# Patient Record
Sex: Male | Born: 1950 | Race: White | Hispanic: No | Marital: Married
Health system: Southern US, Community
[De-identification: ages and names within clinical notes are randomized; demographics above are authoritative.]

## PROBLEM LIST (undated history)

## (undated) DIAGNOSIS — J1282 Pneumonia due to coronavirus disease 2019: Secondary | ICD-10-CM

## (undated) DIAGNOSIS — U071 COVID-19: Secondary | ICD-10-CM

## (undated) DIAGNOSIS — I482 Chronic atrial fibrillation, unspecified: Secondary | ICD-10-CM

## (undated) DIAGNOSIS — J9621 Acute and chronic respiratory failure with hypoxia: Secondary | ICD-10-CM

## (undated) DIAGNOSIS — J189 Pneumonia, unspecified organism: Secondary | ICD-10-CM

---

## 2020-12-06 ENCOUNTER — Inpatient Hospital Stay
Admit: 2020-12-06 | Discharge: 2020-12-16 | Disposition: A | Discharge status: Still In Hospital | Payer: Medicare Other | Source: Ambulatory Visit | Attending: Internal Medicine | Admitting: Internal Medicine

## 2020-12-06 DIAGNOSIS — J1282 Pneumonia due to coronavirus disease 2019: Secondary | ICD-10-CM | POA: Diagnosis present

## 2020-12-06 DIAGNOSIS — Z789 Other specified health status: Secondary | ICD-10-CM

## 2020-12-06 DIAGNOSIS — J9621 Acute and chronic respiratory failure with hypoxia: Secondary | ICD-10-CM | POA: Diagnosis present

## 2020-12-06 DIAGNOSIS — R0902 Hypoxemia: Secondary | ICD-10-CM

## 2020-12-06 DIAGNOSIS — R52 Pain, unspecified: Secondary | ICD-10-CM

## 2020-12-06 DIAGNOSIS — Z0189 Encounter for other specified special examinations: Secondary | ICD-10-CM

## 2020-12-06 DIAGNOSIS — U071 COVID-19: Secondary | ICD-10-CM | POA: Diagnosis present

## 2020-12-06 DIAGNOSIS — I482 Chronic atrial fibrillation, unspecified: Secondary | ICD-10-CM | POA: Diagnosis present

## 2020-12-06 DIAGNOSIS — J189 Pneumonia, unspecified organism: Secondary | ICD-10-CM | POA: Diagnosis present

## 2020-12-06 DIAGNOSIS — J9690 Respiratory failure, unspecified, unspecified whether with hypoxia or hypercapnia: Secondary | ICD-10-CM

## 2020-12-06 HISTORY — DX: Acute and chronic respiratory failure with hypoxia: J96.21

## 2020-12-06 HISTORY — DX: Chronic atrial fibrillation, unspecified: I48.20

## 2020-12-06 HISTORY — DX: Pneumonia due to coronavirus disease 2019: J12.82

## 2020-12-06 HISTORY — DX: Pneumonia, unspecified organism: J18.9

## 2020-12-06 HISTORY — DX: COVID-19: U07.1

## 2020-12-07 ENCOUNTER — Other Ambulatory Visit (HOSPITAL_COMMUNITY): Payer: Medicare Other

## 2020-12-07 LAB — CBC
HCT: 49.8 % (ref 39.0–52.0)
Hemoglobin: 16.5 g/dL (ref 13.0–17.0)
MCH: 31.4 pg (ref 26.0–34.0)
MCHC: 33.1 g/dL (ref 30.0–36.0)
MCV: 94.7 fL (ref 80.0–100.0)
Platelets: 311 10*3/uL (ref 150–400)
RBC: 5.26 MIL/uL (ref 4.22–5.81)
RDW: 13.2 % (ref 11.5–15.5)
WBC: 10 10*3/uL (ref 4.0–10.5)
nRBC: 0 % (ref 0.0–0.2)

## 2020-12-07 LAB — PHOSPHORUS: Phosphorus: 4.6 mg/dL (ref 2.5–4.6)

## 2020-12-07 LAB — BLOOD GAS, ARTERIAL
Acid-Base Excess: 5.9 mmol/L — ABNORMAL HIGH (ref 0.0–2.0)
Bicarbonate: 29.2 mmol/L — ABNORMAL HIGH (ref 20.0–28.0)
FIO2: 100
O2 Saturation: 95 %
Patient temperature: 37
pCO2 arterial: 36.5 mmHg (ref 32.0–48.0)
pH, Arterial: 7.513 — ABNORMAL HIGH (ref 7.350–7.450)
pO2, Arterial: 70.9 mmHg — ABNORMAL LOW (ref 83.0–108.0)

## 2020-12-07 LAB — PROTIME-INR
INR: 1.7 — ABNORMAL HIGH (ref 0.8–1.2)
Prothrombin Time: 19.1 seconds — ABNORMAL HIGH (ref 11.4–15.2)

## 2020-12-07 LAB — COMPREHENSIVE METABOLIC PANEL
ALT: 89 U/L — ABNORMAL HIGH (ref 0–44)
AST: 105 U/L — ABNORMAL HIGH (ref 15–41)
Albumin: 3 g/dL — ABNORMAL LOW (ref 3.5–5.0)
Alkaline Phosphatase: 82 U/L (ref 38–126)
Anion gap: 15 (ref 5–15)
BUN: 42 mg/dL — ABNORMAL HIGH (ref 8–23)
CO2: 26 mmol/L (ref 22–32)
Calcium: 8.2 mg/dL — ABNORMAL LOW (ref 8.9–10.3)
Chloride: 88 mmol/L — ABNORMAL LOW (ref 98–111)
Creatinine, Ser: 1.41 mg/dL — ABNORMAL HIGH (ref 0.61–1.24)
GFR, Estimated: 54 mL/min — ABNORMAL LOW (ref >60)
Glucose, Bld: 108 mg/dL — ABNORMAL HIGH (ref 70–99)
Potassium: 3.3 mmol/L — ABNORMAL LOW (ref 3.5–5.1)
Sodium: 129 mmol/L — ABNORMAL LOW (ref 135–145)
Total Bilirubin: 3 mg/dL — ABNORMAL HIGH (ref 0.3–1.2)
Total Protein: 6.9 g/dL (ref 6.5–8.1)

## 2020-12-07 LAB — MAGNESIUM: Magnesium: 2.4 mg/dL (ref 1.7–2.4)

## 2020-12-07 LAB — APTT: aPTT: 35 seconds (ref 24–36)

## 2020-12-08 LAB — BASIC METABOLIC PANEL
Anion gap: 13 (ref 5–15)
BUN: 48 mg/dL — ABNORMAL HIGH (ref 8–23)
CO2: 32 mmol/L (ref 22–32)
Calcium: 8.2 mg/dL — ABNORMAL LOW (ref 8.9–10.3)
Chloride: 91 mmol/L — ABNORMAL LOW (ref 98–111)
Creatinine, Ser: 1.42 mg/dL — ABNORMAL HIGH (ref 0.61–1.24)
GFR, Estimated: 53 mL/min — ABNORMAL LOW (ref >60)
Glucose, Bld: 192 mg/dL — ABNORMAL HIGH (ref 70–99)
Potassium: 4 mmol/L (ref 3.5–5.1)
Sodium: 136 mmol/L (ref 135–145)

## 2020-12-09 ENCOUNTER — Other Ambulatory Visit (HOSPITAL_COMMUNITY): Payer: Medicare Other

## 2020-12-09 DIAGNOSIS — J189 Pneumonia, unspecified organism: Secondary | ICD-10-CM

## 2020-12-09 DIAGNOSIS — U071 COVID-19: Secondary | ICD-10-CM

## 2020-12-09 DIAGNOSIS — I482 Chronic atrial fibrillation, unspecified: Secondary | ICD-10-CM | POA: Diagnosis not present

## 2020-12-09 DIAGNOSIS — J1282 Pneumonia due to coronavirus disease 2019: Secondary | ICD-10-CM

## 2020-12-09 DIAGNOSIS — J9621 Acute and chronic respiratory failure with hypoxia: Secondary | ICD-10-CM

## 2020-12-09 NOTE — Consult Note (Signed)
Pulmonary Critical Care Medicine [facility] [place]  PULMONARY SERVICE  Date of Service: 12/09/2020  PULMONARY CRITICAL CARE CONSULT   Matthew Oliver  MPN:[id number]  DOB: 1951-01-02   DOA: 12/06/2020  Referring Physician: Carron Curie, MD  HPI: Matthew Oliver is a 70 y.o. male seen for follow up of Acute on Chronic Respiratory Failure.  Patient has multiple medical problems including hypertension hyperlipidemia came into the hospital because of shortness of breath which are going on for a few months.  Patient also was noted to be more somnolent.  It was initially suspected patient has some obstructive sleep apnea obesity hypoventilation syndrome after admission patient was noted to have temperature with pneumonia and had a positive for COVID test.  Patient was given varsity neb as well as some steroids.  The patient also was placed on anticoagulation had some issues with hemoptysis and therefore the anticoagulation was stopped.  The patient is now transferred to [place] for further management currently is on a high flow 40 L and 100% FiO2 and also history requiring a nonrebreather.  Review of Systems:  ROS performed and is unremarkable other than noted above.  Past Medical History Past Medical History:  Diagnosis Date  . Hyperlipidemia  . Hypertension    Past Surgical History No past surgical history on file.  Social History reports that he has never smoked. He has never used smokeless tobacco. He reports current alcohol use of about 4.0 standard drinks of alcohol per week. He reports that he does not use drugs.  Family History family history includes Hypertension in his father.   Allergies No Known Allergies    Medications: Reviewed on Rounds  Physical Exam:  Vitals: Temperature is 96.8 pulse 76 respiratory rate 26 blood pressure is 116/80 saturations 90%  Ventilator Settings off the ventilator on heated high flow 40 L FiO2 100%  . General: Comfortable at this  time . Eyes: Grossly normal lids, irises & conjunctiva . ENT: grossly tongue is normal . Neck: no obvious mass . Cardiovascular: S1-S2 normal no gallop or rub . Respiratory: Coarse rhonchi expansion is equal . Abdomen: Soft and nontender . Skin: no rash seen on limited exam . Musculoskeletal: not rigid . Psychiatric:unable to assess . Neurologic: no seizure no involuntary movements         Labs on Admission:  Basic Metabolic Panel: Recent Labs  Lab 12/07/20 0527 12/08/20 0417  NA 129* 136  K 3.3* 4.0  CL 88* 91*  CO2 26 32  GLUCOSE 108* 192*  BUN 42* 48*  CREATININE 1.41* 1.42*  CALCIUM 8.2* 8.2*  MG 2.4  --   PHOS 4.6  --     Recent Labs  Lab 12/07/20 0937  PHART 7.513*  PCO2ART 36.5  PO2ART 70.9*  HCO3 29.2*  O2SAT 95.0    Liver Function Tests: Recent Labs  Lab 12/07/20 0527  AST 105*  ALT 89*  ALKPHOS 82  BILITOT 3.0*  PROT 6.9  ALBUMIN 3.0*   No results for input(s): LIPASE, AMYLASE in the last 168 hours. No results for input(s): AMMONIA in the last 168 hours.  CBC: Recent Labs  Lab 12/07/20 0527  WBC 10.0  HGB 16.5  HCT 49.8  MCV 94.7  PLT 311    Cardiac Enzymes: No results for input(s): CKTOTAL, CKMB, CKMBINDEX, TROPONINI in the last 168 hours.  BNP (last 3 results) No results for input(s): BNP in the last 8760 hours.  ProBNP (last 3 results) No results for input(s): PROBNP in the  last 8760 hours.   Radiological Exams on Admission: DG Chest 1 View  Result Date: 12/07/2020 CLINICAL DATA:  Respiratory failure. EXAM: CHEST  1 VIEW COMPARISON:  No prior. FINDINGS: Cardiomegaly. Diffuse bilateral interstitial infiltrates/edema. Low lung volumes. No pleural effusion or pneumothorax. Metallic fragments noted over the right chest. No acute bony abnormality identified. Thoracic spine scoliosis and degenerative change. IMPRESSION: 1. Cardiomegaly. 2. Diffuse bilateral interstitial infiltrates/edema. Low lung volumes. Electronically Signed    By: Maisie Fus  Register   On: 12/07/2020 06:07    Assessment/Plan Active Problems:   Acute on chronic respiratory failure with hypoxia (HCC)   Chronic atrial fibrillation (HCC)   COVID-19 virus infection   Pneumonia due to COVID-19 virus   Community acquired pneumonia of both lower lobes   1. Acute on chronic respiratory failure with hypoxia at this time patient's oxygen requirements are still quite high we will continue to monitor closely can maintain saturations greater than or equal to 88%. 2. Chronic atrial fibrillation currently the patient's rate is controlled this is apparently a new diagnosis. 3. Community-acquired pneumonia patient was treated with antibiotics at the other facility we will continue with supportive care 4. COVID-19 virus infection in recovery right now but still has diffuse interstitial infiltrates consistent with Covid pneumonia. 5. COVID-19 pneumonia patient was treated with steroids as well as baricitinib will continue to monitor closely patient also has received empiric antibiotics  I have personally seen and evaluated the patient, evaluated laboratory and imaging results, formulated the assessment and plan and placed orders. The Patient requires high complexity decision making with multiple systems involvement.  Case was discussed on Rounds with the Respiratory Therapy Director and the Respiratory staff Time Spent  Yevonne Pax, MD [facility] Pulmonary Critical Care Medicine Sleep Medicine

## 2020-12-10 ENCOUNTER — Encounter: Payer: Self-pay | Admitting: Internal Medicine

## 2020-12-10 DIAGNOSIS — J189 Pneumonia, unspecified organism: Secondary | ICD-10-CM | POA: Diagnosis not present

## 2020-12-10 DIAGNOSIS — U071 COVID-19: Secondary | ICD-10-CM | POA: Diagnosis present

## 2020-12-10 DIAGNOSIS — J1282 Pneumonia due to coronavirus disease 2019: Secondary | ICD-10-CM | POA: Diagnosis present

## 2020-12-10 DIAGNOSIS — I482 Chronic atrial fibrillation, unspecified: Secondary | ICD-10-CM | POA: Diagnosis not present

## 2020-12-10 DIAGNOSIS — J9621 Acute and chronic respiratory failure with hypoxia: Secondary | ICD-10-CM | POA: Diagnosis not present

## 2020-12-10 NOTE — Progress Notes (Signed)
Pulmonary Critical Care Medicine [facility] [place]   PULMONARY CRITICAL CARE SERVICE  PROGRESS NOTE  Date of Service: 12/10/2020  Matthew Oliver  XIP:[id number]  DOB: [birth year]   DOA: 12/06/2020  Referring Physician: Carron Curie, MD  HPI: Matthew Oliver is a 70 y.o. male seen for follow up of Acute on Chronic Respiratory Failure.  Patient currently is on 40 L of oxygen saturations 100%  Medications: Reviewed on Rounds  Physical Exam:  Vitals: Temperature is 97.8 pulse 74 respiratory 23 blood pressure is 116/77 saturations 85  Ventilator Settings on 40 L high flow 100% nonrebreather  . General: Comfortable at this time . Eyes: Grossly normal lids, irises & conjunctiva . ENT: grossly tongue is normal . Neck: no obvious mass . Cardiovascular: S1 S2 normal no gallop . Respiratory: No rhonchi coarse breath sounds . Abdomen: soft . Skin: no rash seen on limited exam . Musculoskeletal: not rigid . Psychiatric:unable to assess . Neurologic: no seizure no involuntary movements         Lab Data:   Basic Metabolic Panel: Recent Labs  Lab 12/07/20 0527 12/08/20 0417  NA 129* 136  K 3.3* 4.0  CL 88* 91*  CO2 26 32  GLUCOSE 108* 192*  BUN 42* 48*  CREATININE 1.41* 1.42*  CALCIUM 8.2* 8.2*  MG 2.4  --   PHOS 4.6  --     ABG: Recent Labs  Lab 12/07/20 0937  PHART 7.513*  PCO2ART 36.5  PO2ART 70.9*  HCO3 29.2*  O2SAT 95.0    Liver Function Tests: Recent Labs  Lab 12/07/20 0527  AST 105*  ALT 89*  ALKPHOS 82  BILITOT 3.0*  PROT 6.9  ALBUMIN 3.0*   No results for input(s): LIPASE, AMYLASE in the last 168 hours. No results for input(s): AMMONIA in the last 168 hours.  CBC: Recent Labs  Lab 12/07/20 0527  WBC 10.0  HGB 16.5  HCT 49.8  MCV 94.7  PLT 311    Cardiac Enzymes: No results for input(s): CKTOTAL, CKMB, CKMBINDEX, TROPONINI in the last 168 hours.  BNP (last 3 results) No results for input(s): BNP in the last 8760  hours.  ProBNP (last 3 results) No results for input(s): PROBNP in the last 8760 hours.  Radiological Exams: DG Abd 1 View  Result Date: 12/09/2020 CLINICAL DATA:  Check NG tube EXAM: ABDOMEN - 1 VIEW COMPARISON:  None. FINDINGS: Weighted feeding catheter is noted with the tip in the second portion of the duodenum. Scattered large and small bowel gas is noted. IMPRESSION: Dobbhoff catheter in the second portion of the duodenum. Electronically Signed   By: Alcide Clever M.D.   On: 12/09/2020 16:24    Assessment/Plan Active Problems:   Acute on chronic respiratory failure with hypoxia (HCC)   Chronic atrial fibrillation (HCC)   COVID-19 virus infection   Pneumonia due to COVID-19 virus   Community acquired pneumonia of both lower lobes   1. Acute on chronic respiratory failure hypoxia we will continue with oxygen therapy titrate as tolerated. 2. Chronic atrial fibrillation rate is controlled 3. COVID-19 virus infection 4. Pneumonia due to COVID-19 slow improvement 5. Community-acquired pneumonia treated we will continue to follow along   I have personally seen and evaluated the patient, evaluated laboratory and imaging results, formulated the assessment and plan and placed orders. The Patient requires high complexity decision making with multiple systems involvement.  Rounds were done with the Respiratory Therapy Director and Staff therapists and discussed with nursing staff also.  Allyne Gee, MD [facility] Pulmonary Critical Care Medicine Sleep Medicine

## 2020-12-11 ENCOUNTER — Other Ambulatory Visit (HOSPITAL_COMMUNITY): Payer: Medicare Other

## 2020-12-11 DIAGNOSIS — J9621 Acute and chronic respiratory failure with hypoxia: Secondary | ICD-10-CM | POA: Diagnosis not present

## 2020-12-11 DIAGNOSIS — U071 COVID-19: Secondary | ICD-10-CM | POA: Diagnosis not present

## 2020-12-11 DIAGNOSIS — I482 Chronic atrial fibrillation, unspecified: Secondary | ICD-10-CM | POA: Diagnosis not present

## 2020-12-11 DIAGNOSIS — J189 Pneumonia, unspecified organism: Secondary | ICD-10-CM | POA: Diagnosis not present

## 2020-12-11 NOTE — Progress Notes (Signed)
Pulmonary Critical Care Medicine [facility] [place]   PULMONARY CRITICAL CARE SERVICE  PROGRESS NOTE  Date of Service: 12/11/2020  Matthew Oliver  [id number]  DOB: [birth year]   DOA: 12/06/2020  Referring Physician: Carron Curie, MD  HPI: Matthew Oliver is a 70 y.o. male seen for follow up of Acute on Chronic Respiratory Failure.  Patient currently is on high flow 40 L 100% FiO2  Medications: Reviewed on Rounds  Physical Exam:  Vitals: Temperature is 96.6 pulse 104 respiratory rate 39 blood pressure 139/79 saturations 100%  Ventilator Settings on high flow 40 L FiO2 100%  . General: Comfortable at this time . Eyes: Grossly normal lids, irises & conjunctiva . ENT: grossly tongue is normal . Neck: no obvious mass . Cardiovascular: S1 S2 normal no gallop . Respiratory: Scattered rhonchi expansion is equal . Abdomen: soft . Skin: no rash seen on limited exam . Musculoskeletal: not rigid . Psychiatric:unable to assess . Neurologic: no seizure no involuntary movements         Lab Data:   Basic Metabolic Panel: Recent Labs  Lab 12/07/20 0527 12/08/20 0417  NA 129* 136  K 3.3* 4.0  CL 88* 91*  CO2 26 32  GLUCOSE 108* 192*  BUN 42* 48*  CREATININE 1.41* 1.42*  CALCIUM 8.2* 8.2*  MG 2.4  --   PHOS 4.6  --     ABG: Recent Labs  Lab 12/07/20 0937  PHART 7.513*  PCO2ART 36.5  PO2ART 70.9*  HCO3 29.2*  O2SAT 95.0    Liver Function Tests: Recent Labs  Lab 12/07/20 0527  AST 105*  ALT 89*  ALKPHOS 82  BILITOT 3.0*  PROT 6.9  ALBUMIN 3.0*   No results for input(s): LIPASE, AMYLASE in the last 168 hours. No results for input(s): AMMONIA in the last 168 hours.  CBC: Recent Labs  Lab 12/07/20 0527  WBC 10.0  HGB 16.5  HCT 49.8  MCV 94.7  PLT 311    Cardiac Enzymes: No results for input(s): CKTOTAL, CKMB, CKMBINDEX, TROPONINI in the last 168 hours.  BNP (last 3 results) No results for input(s): BNP in the last 8760 hours.  ProBNP  (last 3 results) No results for input(s): PROBNP in the last 8760 hours.  Radiological Exams: DG Abd 1 View  Result Date: 12/09/2020 CLINICAL DATA:  Check NG tube EXAM: ABDOMEN - 1 VIEW COMPARISON:  None. FINDINGS: Weighted feeding catheter is noted with the tip in the second portion of the duodenum. Scattered large and small bowel gas is noted. IMPRESSION: Dobbhoff catheter in the second portion of the duodenum. Electronically Signed   By: Alcide Clever M.D.   On: 12/09/2020 16:24    Assessment/Plan Active Problems:   Acute on chronic respiratory failure with hypoxia (HCC)   Chronic atrial fibrillation (HCC)   COVID-19 virus infection   Pneumonia due to COVID-19 virus   Community acquired pneumonia of both lower lobes   1. Acute on chronic respiratory failure hypoxia we will continue with heated high flow patient is on 40 L of oxygen 2. Chronic atrial fibrillation rate is controlled 3. COVID-19 virus infection rate now is in recovery still severe disease 4. Pneumonia due to COVID-19 slow improvement we will continue to monitor. 5. Community-acquired pneumonia has been treated bilateral pneumonia was noted on the previous hospitalization   I have personally seen and evaluated the patient, evaluated laboratory and imaging results, formulated the assessment and plan and placed orders. The Patient requires high complexity decision making  with multiple systems involvement.  Rounds were done with the Respiratory Therapy Director and Staff therapists and discussed with nursing staff also.  Allyne Gee, MD [facility] Pulmonary Critical Care Medicine Sleep Medicine

## 2020-12-12 ENCOUNTER — Other Ambulatory Visit (HOSPITAL_COMMUNITY): Payer: Medicare Other

## 2020-12-12 DIAGNOSIS — U071 COVID-19: Secondary | ICD-10-CM | POA: Diagnosis not present

## 2020-12-12 DIAGNOSIS — I482 Chronic atrial fibrillation, unspecified: Secondary | ICD-10-CM | POA: Diagnosis not present

## 2020-12-12 DIAGNOSIS — J9621 Acute and chronic respiratory failure with hypoxia: Secondary | ICD-10-CM | POA: Diagnosis not present

## 2020-12-12 DIAGNOSIS — J189 Pneumonia, unspecified organism: Secondary | ICD-10-CM | POA: Diagnosis not present

## 2020-12-12 LAB — BASIC METABOLIC PANEL
Anion gap: 15 (ref 5–15)
BUN: 51 mg/dL — ABNORMAL HIGH (ref 8–23)
CO2: 28 mmol/L (ref 22–32)
Calcium: 8.4 mg/dL — ABNORMAL LOW (ref 8.9–10.3)
Chloride: 99 mmol/L (ref 98–111)
Creatinine, Ser: 1.29 mg/dL — ABNORMAL HIGH (ref 0.61–1.24)
GFR, Estimated: >60 mL/min (ref >60)
Glucose, Bld: 153 mg/dL — ABNORMAL HIGH (ref 70–99)
Potassium: 4 mmol/L (ref 3.5–5.1)
Sodium: 142 mmol/L (ref 135–145)

## 2020-12-12 NOTE — Progress Notes (Signed)
Pulmonary Critical Care Medicine [facility] [place]   PULMONARY CRITICAL CARE SERVICE  PROGRESS NOTE  Date of Service: 12/12/2020  Matthew Oliver  [id number]  DOB: 1951-05-19   DOA: 12/06/2020  Referring Physician: Carron Curie, MD  HPI: Matthew Oliver is a 70 y.o. male seen for follow up of Acute on Chronic Respiratory Failure.  Patient currently is on BiPAP has been on a BiPAP of 16/8  Medications: Reviewed on Rounds  Physical Exam:  Vitals: Temperature is 97.6 pulse 89 respiratory 28 blood pressure is 139/78 saturations 86%  Ventilator Settings on BiPAP 16/8  . General: Comfortable at this time . Eyes: Grossly normal lids, irises & conjunctiva . ENT: grossly tongue is normal . Neck: no obvious mass . Cardiovascular: S1 S2 normal no gallop . Respiratory: Coarse rhonchi expansion equal . Abdomen: soft . Skin: no rash seen on limited exam . Musculoskeletal: not rigid . Psychiatric:unable to assess . Neurologic: no seizure no involuntary movements         Lab Data:   Basic Metabolic Panel: Recent Labs  Lab 12/07/20 0527 12/08/20 0417 12/12/20 0435  NA 129* 136 142  K 3.3* 4.0 4.0  CL 88* 91* 99  CO2 26 32 28  GLUCOSE 108* 192* 153*  BUN 42* 48* 51*  CREATININE 1.41* 1.42* 1.29*  CALCIUM 8.2* 8.2* 8.4*  MG 2.4  --   --   PHOS 4.6  --   --     ABG: Recent Labs  Lab 12/07/20 0937  PHART 7.513*  PCO2ART 36.5  PO2ART 70.9*  HCO3 29.2*  O2SAT 95.0    Liver Function Tests: Recent Labs  Lab 12/07/20 0527  AST 105*  ALT 89*  ALKPHOS 82  BILITOT 3.0*  PROT 6.9  ALBUMIN 3.0*   No results for input(s): LIPASE, AMYLASE in the last 168 hours. No results for input(s): AMMONIA in the last 168 hours.  CBC: Recent Labs  Lab 12/07/20 0527  WBC 10.0  HGB 16.5  HCT 49.8  MCV 94.7  PLT 311    Cardiac Enzymes: No results for input(s): CKTOTAL, CKMB, CKMBINDEX, TROPONINI in the last 168 hours.  BNP (last 3 results) No results for  input(s): BNP in the last 8760 hours.  ProBNP (last 3 results) No results for input(s): PROBNP in the last 8760 hours.  Radiological Exams: DG CHEST PORT 1 VIEW  Result Date: 12/12/2020 CLINICAL DATA:  Acute on chronic respiratory failure. EXAM: PORTABLE CHEST 1 VIEW COMPARISON:  Chest x-ray 12/07/2020 FINDINGS: Enteric tube coursing below the hemidiaphragm with tip collimated off view. The heart size and mediastinal contours are unchanged with persistent cardiomegaly. Interval worsening of diffuse patchy airspace and interstitial opacities. No pleural effusion. No pneumothorax. No acute osseous abnormality. Retained metallic densities at the right base again noted. IMPRESSION: 1. Interval worsening of diffuse patchy airspace and interstitial opacities. 2. Enteric tube courses below the hemidiaphragm with tip collimated off view. Electronically Signed   By: Tish Frederickson M.D.   On: 12/12/2020 06:50    Assessment/Plan Active Problems:   Acute on chronic respiratory failure with hypoxia (HCC)   Chronic atrial fibrillation (HCC)   COVID-19 virus infection   Pneumonia due to COVID-19 virus   Community acquired pneumonia of both lower lobes   1. Acute on chronic respiratory failure with hypoxia there is been some worsening of the infiltrates noted on the chest film spoke with primary care team to update also spoke with respiratory therapy on rounds to increase the BiPAP  pressure will try to go with the BiPAP of 20/10 2. Chronic atrial fibrillation rate now rate controlled we will continue with supportive care 3. COVID-19 virus infection recovery 4. Pneumonia due to COVID-19 treated 5. Community-acquired pneumonia treated supportive care   I have personally seen and evaluated the patient, evaluated laboratory and imaging results, formulated the assessment and plan and placed orders. The Patient requires high complexity decision making with multiple systems involvement.  Rounds were done with  the Respiratory Therapy Director and Staff therapists and discussed with nursing staff also.  Yevonne Pax, MD [facility] Pulmonary Critical Care Medicine Sleep Medicine

## 2020-12-13 DIAGNOSIS — I482 Chronic atrial fibrillation, unspecified: Secondary | ICD-10-CM | POA: Diagnosis not present

## 2020-12-13 DIAGNOSIS — U071 COVID-19: Secondary | ICD-10-CM | POA: Diagnosis not present

## 2020-12-13 DIAGNOSIS — J9621 Acute and chronic respiratory failure with hypoxia: Secondary | ICD-10-CM | POA: Diagnosis not present

## 2020-12-13 DIAGNOSIS — J189 Pneumonia, unspecified organism: Secondary | ICD-10-CM | POA: Diagnosis not present

## 2020-12-13 LAB — CBC
HCT: 50.3 % (ref 39.0–52.0)
Hemoglobin: 16.3 g/dL (ref 13.0–17.0)
MCH: 31.4 pg (ref 26.0–34.0)
MCHC: 32.4 g/dL (ref 30.0–36.0)
MCV: 96.9 fL (ref 80.0–100.0)
Platelets: 248 10*3/uL (ref 150–400)
RBC: 5.19 MIL/uL (ref 4.22–5.81)
RDW: 13.7 % (ref 11.5–15.5)
WBC: 16.1 10*3/uL — ABNORMAL HIGH (ref 4.0–10.5)
nRBC: 0.1 % (ref 0.0–0.2)

## 2020-12-13 LAB — BASIC METABOLIC PANEL
Anion gap: 16 — ABNORMAL HIGH (ref 5–15)
BUN: 69 mg/dL — ABNORMAL HIGH (ref 8–23)
CO2: 28 mmol/L (ref 22–32)
Calcium: 8.2 mg/dL — ABNORMAL LOW (ref 8.9–10.3)
Chloride: 105 mmol/L (ref 98–111)
Creatinine, Ser: 1.33 mg/dL — ABNORMAL HIGH (ref 0.61–1.24)
GFR, Estimated: 58 mL/min — ABNORMAL LOW (ref >60)
Glucose, Bld: 163 mg/dL — ABNORMAL HIGH (ref 70–99)
Potassium: 3.5 mmol/L (ref 3.5–5.1)
Sodium: 149 mmol/L — ABNORMAL HIGH (ref 135–145)

## 2020-12-13 NOTE — Progress Notes (Signed)
Pulmonary Critical Care Medicine [facility] [place]   PULMONARY CRITICAL CARE SERVICE  PROGRESS NOTE  Date of Service: 12/13/2020  Matthew Oliver  [id number]  DOB: December 03, 1950   DOA: 12/06/2020  Referring Physician: Carron Curie, MD  HPI: Matthew Oliver is a 70 y.o. male seen for follow up of Acute on Chronic Respiratory Failure.  Patient remains on BiPAP 20/10 apparently was changed over to a full code  Medications: Reviewed on Rounds  Physical Exam:  Vitals: Temperature is 97.5 pulse 79 respiratory 25 blood pressure is 138/82 saturations 98%  Ventilator Settings on BiPAP 20/10  . General: Comfortable at this time . Eyes: Grossly normal lids, irises & conjunctiva . ENT: grossly tongue is normal . Neck: no obvious mass . Cardiovascular: S1 S2 normal no gallop . Respiratory: Scattered rhonchi expansion is equal . Abdomen: soft . Skin: no rash seen on limited exam . Musculoskeletal: not rigid . Psychiatric:unable to assess . Neurologic: no seizure no involuntary movements         Lab Data:   Basic Metabolic Panel: Recent Labs  Lab 12/07/20 0527 12/08/20 0417 12/12/20 0435  NA 129* 136 142  K 3.3* 4.0 4.0  CL 88* 91* 99  CO2 26 32 28  GLUCOSE 108* 192* 153*  BUN 42* 48* 51*  CREATININE 1.41* 1.42* 1.29*  CALCIUM 8.2* 8.2* 8.4*  MG 2.4  --   --   PHOS 4.6  --   --     ABG: Recent Labs  Lab 12/07/20 0937  PHART 7.513*  PCO2ART 36.5  PO2ART 70.9*  HCO3 29.2*  O2SAT 95.0    Liver Function Tests: Recent Labs  Lab 12/07/20 0527  AST 105*  ALT 89*  ALKPHOS 82  BILITOT 3.0*  PROT 6.9  ALBUMIN 3.0*   No results for input(s): LIPASE, AMYLASE in the last 168 hours. No results for input(s): AMMONIA in the last 168 hours.  CBC: Recent Labs  Lab 12/07/20 0527  WBC 10.0  HGB 16.5  HCT 49.8  MCV 94.7  PLT 311    Cardiac Enzymes: No results for input(s): CKTOTAL, CKMB, CKMBINDEX, TROPONINI in the last 168 hours.  BNP (last 3  results) No results for input(s): BNP in the last 8760 hours.  ProBNP (last 3 results) No results for input(s): PROBNP in the last 8760 hours.  Radiological Exams: DG CHEST PORT 1 VIEW  Result Date: 12/12/2020 CLINICAL DATA:  Acute on chronic respiratory failure. EXAM: PORTABLE CHEST 1 VIEW COMPARISON:  Chest x-ray 12/07/2020 FINDINGS: Enteric tube coursing below the hemidiaphragm with tip collimated off view. The heart size and mediastinal contours are unchanged with persistent cardiomegaly. Interval worsening of diffuse patchy airspace and interstitial opacities. No pleural effusion. No pneumothorax. No acute osseous abnormality. Retained metallic densities at the right base again noted. IMPRESSION: 1. Interval worsening of diffuse patchy airspace and interstitial opacities. 2. Enteric tube courses below the hemidiaphragm with tip collimated off view. Electronically Signed   By: Tish Frederickson M.D.   On: 12/12/2020 06:50    Assessment/Plan Active Problems:   Acute on chronic respiratory failure with hypoxia (HCC)   Chronic atrial fibrillation (HCC)   COVID-19 virus infection   Pneumonia due to COVID-19 virus   Community acquired pneumonia of both lower lobes   1. Acute on chronic respiratory failure hypoxia we will continue with BiPAP I am concerned that patient may end up having to be intubated in the coming 24 hours. 2. Chronic atrial fibrillation rate is controlled we  will continue to follow 3. COVID-19 virus infection treated 4. Pneumonia due to COVID-19 slow improvement we will continue to follow.   I have personally seen and evaluated the patient, evaluated laboratory and imaging results, formulated the assessment and plan and placed orders. The Patient requires high complexity decision making with multiple systems involvement.  Rounds were done with the Respiratory Therapy Director and Staff therapists and discussed with nursing staff also.  Yevonne Pax, MD [facility] Pulmonary  Critical Care Medicine Sleep Medicine

## 2020-12-14 DIAGNOSIS — J9621 Acute and chronic respiratory failure with hypoxia: Secondary | ICD-10-CM | POA: Diagnosis not present

## 2020-12-14 DIAGNOSIS — U071 COVID-19: Secondary | ICD-10-CM | POA: Diagnosis not present

## 2020-12-14 DIAGNOSIS — I482 Chronic atrial fibrillation, unspecified: Secondary | ICD-10-CM | POA: Diagnosis not present

## 2020-12-14 DIAGNOSIS — J189 Pneumonia, unspecified organism: Secondary | ICD-10-CM | POA: Diagnosis not present

## 2020-12-14 NOTE — Progress Notes (Signed)
Pulmonary Critical Care Medicine [facility] [place]   PULMONARY CRITICAL CARE SERVICE  PROGRESS NOTE  Date of Service: 12/14/2020  Algenis Ballin  OJJ:[id number]  DOB: 03/27/1951   DOA: 12/06/2020  Referring Physician: Carron Curie, MD  HPI: Matthew Oliver is a 70 y.o. male seen for follow up of Acute on Chronic Respiratory Failure.  Patient currently is on BiPAP has been on 20/10 this is now 48 hours.  Discussed during rounds the possibility that the patient may end up having to go on mechanical ventilation however it is my understanding that the patient is a DNR will need to discuss further  Medications: Reviewed on Rounds  Physical Exam:  Vitals: Temperature 97.2 pulse 99 respiratory respiratory 32 blood pressure is 138/78 saturations 91  Ventilator Settings on BiPAP 20/10  . General: Comfortable at this time . Eyes: Grossly normal lids, irises & conjunctiva . ENT: grossly tongue is normal . Neck: no obvious mass . Cardiovascular: S1 S2 normal no gallop . Respiratory: Coarse rhonchi expansion is equal . Abdomen: soft . Skin: no rash seen on limited exam . Musculoskeletal: not rigid . Psychiatric:unable to assess . Neurologic: no seizure no involuntary movements         Lab Data:   Basic Metabolic Panel: Recent Labs  Lab 12/08/20 0417 12/12/20 0435 12/13/20 1048  NA 136 142 149*  K 4.0 4.0 3.5  CL 91* 99 105  CO2 32 28 28  GLUCOSE 192* 153* 163*  BUN 48* 51* 69*  CREATININE 1.42* 1.29* 1.33*  CALCIUM 8.2* 8.4* 8.2*    ABG: Recent Labs  Lab 12/07/20 0937  PHART 7.513*  PCO2ART 36.5  PO2ART 70.9*  HCO3 29.2*  O2SAT 95.0    Liver Function Tests: No results for input(s): AST, ALT, ALKPHOS, BILITOT, PROT, ALBUMIN in the last 168 hours. No results for input(s): LIPASE, AMYLASE in the last 168 hours. No results for input(s): AMMONIA in the last 168 hours.  CBC: Recent Labs  Lab 12/13/20 1048  WBC 16.1*  HGB 16.3  HCT 50.3  MCV 96.9  PLT  248    Cardiac Enzymes: No results for input(s): CKTOTAL, CKMB, CKMBINDEX, TROPONINI in the last 168 hours.  BNP (last 3 results) No results for input(s): BNP in the last 8760 hours.  ProBNP (last 3 results) No results for input(s): PROBNP in the last 8760 hours.  Radiological Exams: No results found.  Assessment/Plan Active Problems:   Acute on chronic respiratory failure with hypoxia (HCC)   Chronic atrial fibrillation (HCC)   COVID-19 virus infection   Pneumonia due to COVID-19 virus   Community acquired pneumonia of both lower lobes   1. Acute on chronic respiratory failure hypoxia overall patient is not doing well requiring BiPAP 20/10 now is on the BiPAP for 48 hours. 2. Chronic atrial fibrillation rate is controlled we will continue to monitor 3. COVID-19 virus infection in recovery severe disease 4. Pneumonia due to COVID-19 patient has severe pulmonary damage noted on the last chest film 5. Immunity acquired pneumonia has been treated   I have personally seen and evaluated the patient, evaluated laboratory and imaging results, formulated the assessment and plan and placed orders. The Patient requires high complexity decision making with multiple systems involvement.  Rounds were done with the Respiratory Therapy Director and Staff therapists and discussed with nursing staff also.  Yevonne Pax, MD [facility] Pulmonary Critical Care Medicine Sleep Medicine

## 2020-12-15 ENCOUNTER — Other Ambulatory Visit (HOSPITAL_COMMUNITY): Payer: Medicare Other

## 2020-12-15 DIAGNOSIS — U071 COVID-19: Secondary | ICD-10-CM | POA: Diagnosis not present

## 2020-12-15 DIAGNOSIS — J9621 Acute and chronic respiratory failure with hypoxia: Secondary | ICD-10-CM | POA: Diagnosis not present

## 2020-12-15 DIAGNOSIS — J189 Pneumonia, unspecified organism: Secondary | ICD-10-CM | POA: Diagnosis not present

## 2020-12-15 DIAGNOSIS — I482 Chronic atrial fibrillation, unspecified: Secondary | ICD-10-CM | POA: Diagnosis not present

## 2020-12-15 LAB — BLOOD GAS, ARTERIAL
Acid-Base Excess: 4.2 mmol/L — ABNORMAL HIGH (ref 0.0–2.0)
Acid-Base Excess: 2.5 mmol/L — ABNORMAL HIGH (ref 0.0–2.0)
Acid-Base Excess: 3.5 mmol/L — ABNORMAL HIGH (ref 0.0–2.0)
Bicarbonate: 27.8 mmol/L (ref 20.0–28.0)
Bicarbonate: 26.3 mmol/L (ref 20.0–28.0)
Bicarbonate: 29.8 mmol/L — ABNORMAL HIGH (ref 20.0–28.0)
FIO2: 100
FIO2: 100
FIO2: 100
O2 Saturation: 87.9 %
O2 Saturation: 90.6 %
O2 Saturation: 87 %
Patient temperature: 36.1
Patient temperature: 36.2
Patient temperature: 36.3
pCO2 arterial: 36.9 mmHg (ref 32.0–48.0)
pCO2 arterial: 37.8 mmHg (ref 32.0–48.0)
pCO2 arterial: 64.4 mmHg — ABNORMAL HIGH (ref 32.0–48.0)
pH, Arterial: 7.485 — ABNORMAL HIGH (ref 7.350–7.450)
pH, Arterial: 7.453 — ABNORMAL HIGH (ref 7.350–7.450)
pH, Arterial: 7.283 — ABNORMAL LOW (ref 7.350–7.450)
pO2, Arterial: 53.1 mmHg — ABNORMAL LOW (ref 83.0–108.0)
pO2, Arterial: 59.7 mmHg — ABNORMAL LOW (ref 83.0–108.0)
pO2, Arterial: 63.2 mmHg — ABNORMAL LOW (ref 83.0–108.0)

## 2020-12-15 NOTE — Progress Notes (Signed)
Pulmonary Critical Care Medicine [facility] [place]   PULMONARY CRITICAL CARE SERVICE  PROGRESS NOTE  Date of Service: 12/15/2020  Jamyson Jirak  GYF:[id number]  DOB: [birth year]   DOA: 12/06/2020  Referring Physician: Carron Curie, MD  HPI: Dayveon Halley is a 70 y.o. male seen for follow up of Acute on Chronic Respiratory Failure.  Patient now is on 100% nonrebreather with high flow saturations are about 89%.  The patient currently is a full code according to discussions during rounds.  My concern is that he may end up having to be intubated because of the severe hypoxia had this conversation with respiratory therapist on staff we did do an ABG which confirms low PO2 despite 100% nonrebreather as well as heated high flow  Medications: Reviewed on Rounds  Physical Exam:  Vitals: Temperature is 97.9 pulse 101 respiratory 28 blood pressure is 138/72 saturations 92%  Ventilator Settings on 100% nonrebreather along with heated high flow 40 L  . General: Comfortable at this time . Eyes: Grossly normal lids, irises & conjunctiva . ENT: grossly tongue is normal . Neck: no obvious mass . Cardiovascular: S1 S2 normal no gallop . Respiratory: Scattered rhonchi very coarse breath sound . Abdomen: soft . Skin: no rash seen on limited exam . Musculoskeletal: not rigid . Psychiatric:unable to assess . Neurologic: no seizure no involuntary movements         Lab Data:   Basic Metabolic Panel: Recent Labs  Lab 12/12/20 0435 12/13/20 1048  NA 142 149*  K 4.0 3.5  CL 99 105  CO2 28 28  GLUCOSE 153* 163*  BUN 51* 69*  CREATININE 1.29* 1.33*  CALCIUM 8.4* 8.2*    ABG: Recent Labs  Lab 12/15/20 0839  PHART 7.485*  PCO2ART 36.9  PO2ART 53.1*  HCO3 27.8  O2SAT 87.9    Liver Function Tests: No results for input(s): AST, ALT, ALKPHOS, BILITOT, PROT, ALBUMIN in the last 168 hours. No results for input(s): LIPASE, AMYLASE in the last 168 hours. No results for input(s):  AMMONIA in the last 168 hours.  CBC: Recent Labs  Lab 12/13/20 1048  WBC 16.1*  HGB 16.3  HCT 50.3  MCV 96.9  PLT 248    Cardiac Enzymes: No results for input(s): CKTOTAL, CKMB, CKMBINDEX, TROPONINI in the last 168 hours.  BNP (last 3 results) No results for input(s): BNP in the last 8760 hours.  ProBNP (last 3 results) No results for input(s): PROBNP in the last 8760 hours.  Radiological Exams: DG CHEST PORT 1 VIEW  Result Date: 12/15/2020 CLINICAL DATA:  History of COVID. EXAM: PORTABLE CHEST 1 VIEW COMPARISON:  12/12/2020. FINDINGS: Feeding tube in stable position. Stable cardiomegaly. Diffuse bilateral interstitial prominence consistent with interstitial edema and or pneumonitis again noted. Slight improvement from prior exam. No pleural effusion or pneumothorax. Tiny metallic densities again noted over the right chest. IMPRESSION: 1. Stable cardiomegaly. 2. Diffuse bilateral interstitial prominence consistent with interstitial edema and or pneumonitis again noted. Slight improvement from prior exam. Electronically Signed   By: Maisie Fus  Register   On: 12/15/2020 06:13    Assessment/Plan Active Problems:   Acute on chronic respiratory failure with hypoxia (HCC)   Chronic atrial fibrillation (HCC)   COVID-19 virus infection   Pneumonia due to COVID-19 virus   Community acquired pneumonia of both lower lobes   1. Acute on chronic respiratory failure hypoxia patient is heading towards intubation and mechanical ventilation.  ABG shows a PO2 of 53 he is ventilating fine  however oxygenation remains a major problem.  Chest x-ray was showing diffuse interstitial prominence.  We will do a follow-up ABG this afternoon and make a decision regarding intubation 2. Chronic atrial fibrillation right now rate is controlled we will continue to monitor 3. COVID-19 virus infection in recovery severe pulmonary disease 4. Community-acquired pneumonia has been treated   I have personally seen  and evaluated the patient, evaluated laboratory and imaging results, formulated the assessment and plan and placed orders. The Patient requires high complexity decision making with multiple systems involvement.  Rounds were done with the Respiratory Therapy Director and Staff therapists and discussed with nursing staff also.  Yevonne Pax, MD [facility] Pulmonary Critical Care Medicine Sleep Medicine

## 2020-12-16 ENCOUNTER — Other Ambulatory Visit: Payer: Self-pay

## 2020-12-16 ENCOUNTER — Emergency Department (HOSPITAL_COMMUNITY): Payer: Medicare Other

## 2020-12-16 ENCOUNTER — Other Ambulatory Visit (HOSPITAL_COMMUNITY): Payer: Self-pay

## 2020-12-16 ENCOUNTER — Emergency Department (HOSPITAL_COMMUNITY)
Admission: EM | Admit: 2020-12-16 | Discharge: 2020-12-16 | Disposition: A | Discharge status: Home / Self Care | Payer: Medicare Other | Source: Home / Self Care | Attending: Emergency Medicine | Admitting: Emergency Medicine

## 2020-12-16 ENCOUNTER — Other Ambulatory Visit (HOSPITAL_COMMUNITY): Payer: Medicare Other

## 2020-12-16 ENCOUNTER — Inpatient Hospital Stay
Admission: AD | Admit: 2020-12-16 | Discharge: 2020-12-17 | Disposition: A | Discharge status: Other Acute Inpatient Hospital | Payer: Medicare Other | Source: Other Acute Inpatient Hospital | Attending: Internal Medicine | Admitting: Internal Medicine

## 2020-12-16 DIAGNOSIS — I482 Chronic atrial fibrillation, unspecified: Secondary | ICD-10-CM | POA: Diagnosis present

## 2020-12-16 DIAGNOSIS — J189 Pneumonia, unspecified organism: Secondary | ICD-10-CM | POA: Diagnosis present

## 2020-12-16 DIAGNOSIS — N179 Acute kidney failure, unspecified: Secondary | ICD-10-CM

## 2020-12-16 DIAGNOSIS — J9621 Acute and chronic respiratory failure with hypoxia: Secondary | ICD-10-CM | POA: Diagnosis present

## 2020-12-16 DIAGNOSIS — R579 Shock, unspecified: Secondary | ICD-10-CM | POA: Insufficient documentation

## 2020-12-16 DIAGNOSIS — Z8616 Personal history of COVID-19: Secondary | ICD-10-CM | POA: Insufficient documentation

## 2020-12-16 DIAGNOSIS — R000 Tachycardia, unspecified: Secondary | ICD-10-CM | POA: Insufficient documentation

## 2020-12-16 DIAGNOSIS — U071 COVID-19: Secondary | ICD-10-CM | POA: Diagnosis present

## 2020-12-16 DIAGNOSIS — A4181 Sepsis due to Enterococcus: Secondary | ICD-10-CM | POA: Diagnosis not present

## 2020-12-16 DIAGNOSIS — R0902 Hypoxemia: Secondary | ICD-10-CM

## 2020-12-16 DIAGNOSIS — Z0189 Encounter for other specified special examinations: Secondary | ICD-10-CM

## 2020-12-16 DIAGNOSIS — J9690 Respiratory failure, unspecified, unspecified whether with hypoxia or hypercapnia: Secondary | ICD-10-CM

## 2020-12-16 DIAGNOSIS — J939 Pneumothorax, unspecified: Secondary | ICD-10-CM | POA: Insufficient documentation

## 2020-12-16 DIAGNOSIS — Z9911 Dependence on respirator [ventilator] status: Secondary | ICD-10-CM

## 2020-12-16 LAB — BLOOD GAS, ARTERIAL
Acid-Base Excess: 2.9 mmol/L — ABNORMAL HIGH (ref 0.0–2.0)
Acid-base deficit: 4.8 mmol/L — ABNORMAL HIGH (ref 0.0–2.0)
Bicarbonate: 30.4 mmol/L — ABNORMAL HIGH (ref 20.0–28.0)
Bicarbonate: 22.6 mmol/L (ref 20.0–28.0)
FIO2: 100
FIO2: 100
O2 Saturation: 75 %
O2 Saturation: 84.1 %
Patient temperature: 36.3
Patient temperature: 36
pCO2 arterial: 78.6 mmHg (ref 32.0–48.0)
pCO2 arterial: 61.4 mmHg — ABNORMAL HIGH (ref 32.0–48.0)
pH, Arterial: 7.207 — ABNORMAL LOW (ref 7.350–7.450)
pH, Arterial: 7.183 — CL (ref 7.350–7.450)
pO2, Arterial: 48.9 mmHg — ABNORMAL LOW (ref 83.0–108.0)
pO2, Arterial: 53.6 mmHg — ABNORMAL LOW (ref 83.0–108.0)

## 2020-12-16 LAB — BASIC METABOLIC PANEL
Anion gap: 22 — ABNORMAL HIGH (ref 5–15)
Anion gap: 24 — ABNORMAL HIGH (ref 5–15)
BUN: 137 mg/dL — ABNORMAL HIGH (ref 8–23)
BUN: 136 mg/dL — ABNORMAL HIGH (ref 8–23)
CO2: 25 mmol/L (ref 22–32)
CO2: 19 mmol/L — ABNORMAL LOW (ref 22–32)
Calcium: 8.3 mg/dL — ABNORMAL LOW (ref 8.9–10.3)
Calcium: 6.9 mg/dL — ABNORMAL LOW (ref 8.9–10.3)
Chloride: 107 mmol/L (ref 98–111)
Chloride: 104 mmol/L (ref 98–111)
Creatinine, Ser: 3.41 mg/dL — ABNORMAL HIGH (ref 0.61–1.24)
Creatinine, Ser: 3.99 mg/dL — ABNORMAL HIGH (ref 0.61–1.24)
GFR, Estimated: 19 mL/min — ABNORMAL LOW (ref >60)
GFR, Estimated: 15 mL/min — ABNORMAL LOW (ref >60)
Glucose, Bld: 153 mg/dL — ABNORMAL HIGH (ref 70–99)
Glucose, Bld: 356 mg/dL — ABNORMAL HIGH (ref 70–99)
Potassium: 5.6 mmol/L — ABNORMAL HIGH (ref 3.5–5.1)
Potassium: 6.1 mmol/L — ABNORMAL HIGH (ref 3.5–5.1)
Sodium: 154 mmol/L — ABNORMAL HIGH (ref 135–145)
Sodium: 147 mmol/L — ABNORMAL HIGH (ref 135–145)

## 2020-12-16 LAB — CBC
HCT: 62.2 % — ABNORMAL HIGH (ref 39.0–52.0)
Hemoglobin: 18.4 g/dL — ABNORMAL HIGH (ref 13.0–17.0)
MCH: 31.8 pg (ref 26.0–34.0)
MCHC: 29.6 g/dL — ABNORMAL LOW (ref 30.0–36.0)
MCV: 107.4 fL — ABNORMAL HIGH (ref 80.0–100.0)
Platelets: 181 10*3/uL (ref 150–400)
RBC: 5.79 MIL/uL (ref 4.22–5.81)
RDW: 14.8 % (ref 11.5–15.5)
WBC: 19.2 10*3/uL — ABNORMAL HIGH (ref 4.0–10.5)
nRBC: 0.4 % — ABNORMAL HIGH (ref 0.0–0.2)

## 2020-12-16 LAB — I-STAT ARTERIAL BLOOD GAS, ED
Acid-base deficit: 9 mmol/L — ABNORMAL HIGH (ref 0.0–2.0)
Bicarbonate: 22.4 mmol/L (ref 20.0–28.0)
Calcium, Ion: 0.91 mmol/L — ABNORMAL LOW (ref 1.15–1.40)
HCT: 47 % (ref 39.0–52.0)
Hemoglobin: 16 g/dL (ref 13.0–17.0)
O2 Saturation: 93 %
Patient temperature: 97.3
Potassium: 4.9 mmol/L (ref 3.5–5.1)
Sodium: 154 mmol/L — ABNORMAL HIGH (ref 135–145)
TCO2: 25 mmol/L (ref 22–32)
pCO2 arterial: 72.8 mmHg (ref 32.0–48.0)
pH, Arterial: 7.091 — CL (ref 7.350–7.450)
pO2, Arterial: 91 mmHg (ref 83.0–108.0)

## 2020-12-16 LAB — TRIGLYCERIDES: Triglycerides: 219 mg/dL — ABNORMAL HIGH (ref <150)

## 2020-12-16 LAB — CULTURE, BLOOD (ROUTINE X 2)
Culture: NO GROWTH
Culture: NO GROWTH
Special Requests: ADEQUATE
Special Requests: ADEQUATE

## 2020-12-16 LAB — LACTIC ACID, PLASMA
Lactic Acid, Venous: 5.2 mmol/L (ref 0.5–1.9)
Lactic Acid, Venous: 6 mmol/L (ref 0.5–1.9)

## 2020-12-16 MED ORDER — SODIUM CHLORIDE 0.9 % IV SOLN: 250.0000 mL | INTRAVENOUS | Status: DC

## 2020-12-16 MED ORDER — SODIUM CHLORIDE 0.9 % IV BOLUS
1000.0000 mL | Freq: Once | INTRAVENOUS | Status: AC
  Administered 2020-12-16: 1000 mL via INTRAVENOUS

## 2020-12-16 MED ORDER — ROCURONIUM BROMIDE 50 MG/5ML IV SOLN
INTRAVENOUS | Status: AC | PRN
Start: 2020-12-16 — End: 2020-12-16
  Administered 2020-12-16: 100 mg via INTRAVENOUS

## 2020-12-16 MED ORDER — ETOMIDATE 2 MG/ML IV SOLN
INTRAVENOUS | Status: AC | PRN
Start: 2020-12-16 — End: 2020-12-16
  Administered 2020-12-16: 20 mg via INTRAVENOUS

## 2020-12-16 MED ORDER — NOREPINEPHRINE 4 MG/250ML-% IV SOLN
2.0000 ug/min | INTRAVENOUS | Status: DC
  Administered 2020-12-16: 10 ug/min via INTRAVENOUS
  Filled 2020-12-16: qty 250

## 2020-12-16 NOTE — Progress Notes (Signed)
RT note- Patient reintubated due to blown cuff,  xray taken for confirmation.

## 2020-12-16 NOTE — ED Notes (Signed)
Propofol paused due to BP

## 2020-12-16 NOTE — ED Provider Notes (Signed)
[facility] EMERGENCY DEPARTMENT Provider Note   CSN: [id number] Arrival date & time: 12/16/20  4174  LEVEL 5 CAVEAT - UNRESPONSIVE  History Chief Complaint  Patient presents with  . Respiratory Distress    Matthew Oliver is a 70 y.o. male.  HPI 70 year old [facility].  History is very limited as the patient is intubated and thus unresponsive.  He is being sedated with propofol.  He was intubated yesterday after originally having a bout with COVID-19 on 1/14.  Unfortunately the nursing staff transporting patient does not know which side the pneumothorax is on.  Otherwise, history is very limited. O2 sats have been in the 70s.   Past Medical History:  Diagnosis Date  . Acute on chronic respiratory failure with hypoxia (HCC)   . Chronic atrial fibrillation (HCC)   . Community acquired pneumonia of both lower lobes   . COVID-19 virus infection   . Pneumonia due to COVID-19 virus     Patient Active Problem List   Diagnosis Date Noted  . Acute on chronic respiratory failure with hypoxia (HCC)   . Chronic atrial fibrillation (HCC)   . COVID-19 virus infection   . Pneumonia due to COVID-19 virus   . Community acquired pneumonia of both lower lobes          No family history on file.     Home Medications Prior to Admission medications   Not on File    Allergies    Patient has no allergy information on record.  Review of Systems   Review of Systems  Unable to perform ROS: Patient unresponsive    Physical Exam Updated Vital Signs BP (!) 91/57   Pulse (!) 105   Temp (!) 97.3 F (36.3 C) (Axillary)   Resp (!) 34   Ht 5\' 10"  (1.778 m)   Wt 120 kg   SpO2 91%   BMI 37.96 kg/m   Physical Exam Vitals and nursing note reviewed.  Constitutional:      Appearance: He is well-developed and well-nourished.     Interventions: He is intubated.  HENT:     Head: Normocephalic and atraumatic.     Right  Ear: External ear normal.     Left Ear: External ear normal.     Nose: Nose normal.  Eyes:     General:        Right eye: No discharge.        Left eye: No discharge.  Cardiovascular:     Rate and Rhythm: Regular rhythm. Tachycardia present.     Heart sounds: Normal heart sounds.  Pulmonary:     Effort: Tachypnea present. He is intubated.     Breath sounds: Examination of the right-lower field reveals decreased breath sounds. Decreased breath sounds present.  Abdominal:     General: There is no distension.  Musculoskeletal:        General: No edema.     Cervical back: Neck supple.  Skin:    General: Skin is warm and dry.  Neurological:     Mental Status: He is unresponsive.  Psychiatric:        Mood and Affect: Mood is not anxious.     ED Results / Procedures / Treatments   Labs (all labs ordered are listed, but only abnormal results are displayed) Labs Reviewed  I-STAT ARTERIAL BLOOD GAS, ED - Abnormal; Notable for the following components:      Result Value   pH,  Arterial 7.091 (*)    pCO2 arterial 72.8 (*)    Acid-base deficit 9.0 (*)    Sodium 154 (*)    Calcium, Ion 0.91 (*)    All other components within normal limits    EKG None  Radiology DG Chest Portable 1 View  Result Date: 12/16/2020 CLINICAL DATA:  RIGHT pneumothorax post thoracostomy tube insertion, hypoxia, COVID-19 EXAM: PORTABLE CHEST 1 VIEW COMPARISON:  Portable exam 0809 hours compared to 0557 hours FINDINGS: Tip of endotracheal tube projects 6.9 cm above carina. Feeding tube extends into abdomen. New RIGHT pigtail thoracostomy tube. Near-complete of previously identified RIGHT pneumothorax, tiny residual at lung base. Diffuse BILATERAL pulmonary infiltrates consistent with multifocal pneumonia and COVID-19. IMPRESSION: Near complete resolution of RIGHT pneumothorax following chest tube insertion. Persistent diffuse BILATERAL pulmonary infiltrates consistent with multifocal pneumonia and COVID-19.  Electronically Signed   By: Ulyses Southward M.D.   On: 12/16/2020 08:21   DG CHEST PORT 1 VIEW  Result Date: 12/16/2020 CLINICAL DATA:  Intubation.  Hypoxia.  COVID. EXAM: PORTABLE CHEST 1 VIEW COMPARISON:  Chest x-ray 12/15/2020. FINDINGS: Endotracheal tube noted stable position. Feeding tube noted with tip in the upper most portion of the stomach. Heart size stable. New moderate sized right pneumothorax noted. Diffuse bilateral pulmonary infiltrates again noted. No prominent pleural effusion. Costophrenic angles incompletely imaged. Small metallic fragments again noted over the right chest. IMPRESSION: 1. Endotracheal tube in stable position. Feeding tube noted with tip in the upper most portion of the stomach. 2. New moderate-sized right pneumothorax. 3. Diffuse bilateral pulmonary infiltrates again noted. Critical Value/emergent results were called by telephone at the time of interpretation on 12/16/2020 at 6:09 am to nurse Morrie Sheldon, who verbally acknowledged these results. Electronically Signed   By: Maisie Fus  Register   On: 12/16/2020 06:10   DG Chest Port 1 View  Result Date: 12/15/2020 CLINICAL DATA:  Intubation. EXAM: PORTABLE CHEST 1 VIEW COMPARISON:  12/15/2020 FINDINGS: The endotracheal tube terminates at the thoracic inlet, approximately 8 cm above the carina. The enteric tube extends below the left hemidiaphragm with the heart size is enlarged. There is vascular congestion and diffuse bilateral hazy interstitial prominence. There is no pneumothorax. There are probable bilateral pleural effusions. IMPRESSION: 1. Lines and tubes as above. Consider further advancing the endotracheal tube by approximately 1-2 cm. 2. Otherwise, no significant interval change in appearance of both lung fields. Electronically Signed   By: Katherine Mantle M.D.   On: 12/15/2020 15:52   DG CHEST PORT 1 VIEW  Result Date: 12/15/2020 CLINICAL DATA:  History of COVID. EXAM: PORTABLE CHEST 1 VIEW COMPARISON:  12/12/2020. FINDINGS:  Feeding tube in stable position. Stable cardiomegaly. Diffuse bilateral interstitial prominence consistent with interstitial edema and or pneumonitis again noted. Slight improvement from prior exam. No pleural effusion or pneumothorax. Tiny metallic densities again noted over the right chest. IMPRESSION: 1. Stable cardiomegaly. 2. Diffuse bilateral interstitial prominence consistent with interstitial edema and or pneumonitis again noted. Slight improvement from prior exam. Electronically Signed   By: Maisie Fus  Register   On: 12/15/2020 06:13    Procedures CHEST TUBE INSERTION  Date/Time: 12/16/2020 8:04 AM Performed by: Pricilla Loveless, MD Authorized by: Pricilla Loveless, MD   Consent:    Consent obtained:  Emergent situation Universal protocol:    Patient identity confirmed:  Anonymous protocol, patient vented/unresponsive and arm band Pre-procedure details:    Skin preparation:  Chlorhexidine   Preparation: Patient was prepped and draped in the usual sterile fashion   Sedation:  Sedation type:  Deep Anesthesia:    Anesthesia method:  None Procedure details:    Placement location:  R lateral   Tube size (Fr):  Minicatheter   Tension pneumothorax: yes     Drainage characteristics: mild amount of blood, mostly only air.   Suture material:  0 silk Post-procedure details:    Post-insertion x-ray findings: tube in good position     Procedure completion:  Tolerated well, no immediate complications .Critical Care Performed by: Pricilla Loveless, MD Authorized by: Pricilla Loveless, MD   Critical care provider statement:    Critical care time (minutes):  60   Critical care time was exclusive of:  Separately billable procedures and treating other patients   Critical care was necessary to treat or prevent imminent or life-threatening deterioration of the following conditions:  Shock and respiratory failure   Critical care was time spent personally by me on the following activities:  Discussions  with consultants, evaluation of patient's response to treatment, examination of patient, ordering and performing treatments and interventions, ordering and review of laboratory studies, ordering and review of radiographic studies, pulse oximetry, re-evaluation of patient's condition, obtaining history from patient or surrogate and review of old charts Procedure Name: Intubation Date/Time: 12/16/2020 11:06 AM Performed by: Pricilla Loveless, MD Pre-anesthesia Checklist: Patient identified, Patient being monitored, Emergency Drugs available, Timeout performed and Suction available Oxygen Delivery Method: Non-rebreather mask Preoxygenation: Pre-oxygenation with 100% oxygen Induction Type: Rapid sequence Ventilation: Mask ventilation without difficulty Laryngoscope Size: Glidescope and 3 Grade View: Grade I Number of attempts: 1 Airway Equipment and Method: Video-laryngoscopy Placement Confirmation: ETT inserted through vocal cords under direct vision,  CO2 detector and Breath sounds checked- equal and bilateral Secured at: 26 cm Dental Injury: Teeth and Oropharynx as per pre-operative assessment         Medications Ordered in ED Medications  0.9 %  sodium chloride infusion (has no administration in time range)  norepinephrine (LEVOPHED) 4mg  in premix infusion (20 mcg/min Intravenous Rate/Dose Change 12/16/20 0845)  etomidate (AMIDATE) injection (20 mg Intravenous Given 12/16/20 1051)  rocuronium (ZEMURON) injection (100 mg Intravenous Given 12/16/20 1052)  sodium chloride 0.9 % bolus 1,000 mL (0 mLs Intravenous Stopped 12/16/20 0852)  sodium chloride 0.9 % bolus 1,000 mL (0 mLs Intravenous Stopped 12/16/20 1027)    ED Course  I have reviewed the triage vital signs and the nursing notes.  Pertinent labs & imaging results that were available during my care of the patient were reviewed by me and considered in my medical decision making (see chart for details).    MDM Rules/Calculators/A&P                           Patient presents with right-sided pneumothorax.  His pulse ox is very poor waveform and hard to get a good waveform but it is saying 70s and 80s.  Thus with urgency a right-sided chest tube was placed.  Has complete resolution.  Unfortunately he is still not doing great and started to become hypotensive.  He was given fluids and chart review shows that his creatinine is now over 3 when yesterday it was about 1.3.  No bladder retention and so he was given IV fluids.  He was also placed on Levophed.  I discussed with Dr. 02/13/21 who is the [facility] and she recommends Levophed and is comfortable with taking patient back rather than keeping in the emergency department.  Will discharge back.  11:08 AM patient was found to have a severe cuff leak this seems to be affecting his oxygenation and ventilation.  Due to this, he was rapid sequence intubated by myself and the tube was changed.  Chest x-ray shows continued resolution of the pneumothorax but worsening lung pathology.  However the ET tube looks in good position.  He continues to require Levophed and I did discuss with Dr. Manson Passey again and she is okay with him being on a higher dose and at this point wants [place] to bring him back despite what appears to be critical illness.  He was given IV fluids.  [facility] staff.  Matthew Oliver was evaluated in Emergency Department on 12/16/2020 for the symptoms described in the history of present illness. He was evaluated in the context of the global COVID-19 pandemic, which necessitated consideration that the patient might be at risk for infection with the SARS-CoV-2 virus that causes COVID-19. Institutional protocols and algorithms that pertain to the evaluation of patients at risk for COVID-19 are in a state of rapid change based on information released by regulatory bodies including the CDC and federal and state organizations. These policies and  algorithms were followed during the patient's care in the ED.  Final Clinical Impression(s) / ED Diagnoses Final diagnoses:  Pneumothorax on right  Acute kidney injury (HCC)  Shock (HCC)    Rx / DC Orders ED Discharge Orders    None       Pricilla Loveless, MD 12/16/20 1141

## 2020-12-16 NOTE — ED Triage Notes (Signed)
[facility] Pneumothor[facility]. Per staff pt dx with covid 11/26/20 and has been on HFNC. Yesterday pt decompensated and had to be intubated. On arrival pt saturations at 78%. EDP at bedside to place chest tube.

## 2020-12-17 ENCOUNTER — Inpatient Hospital Stay (HOSPITAL_COMMUNITY): Payer: Medicare Other

## 2020-12-17 ENCOUNTER — Other Ambulatory Visit (HOSPITAL_COMMUNITY): Payer: Self-pay

## 2020-12-17 ENCOUNTER — Inpatient Hospital Stay (HOSPITAL_COMMUNITY)
Admission: AD | Admit: 2020-12-17 | Discharge: 2021-01-11 | DRG: 871 | Disposition: E | Payer: Medicare Other | Source: Other Acute Inpatient Hospital | Attending: Pulmonary Disease | Admitting: Pulmonary Disease

## 2020-12-17 DIAGNOSIS — Z2239 Carrier of other specified bacterial diseases: Secondary | ICD-10-CM

## 2020-12-17 DIAGNOSIS — Z6841 Body Mass Index (BMI) 40.0 and over, adult: Secondary | ICD-10-CM

## 2020-12-17 DIAGNOSIS — J9601 Acute respiratory failure with hypoxia: Secondary | ICD-10-CM | POA: Diagnosis present

## 2020-12-17 DIAGNOSIS — Z9911 Dependence on respirator [ventilator] status: Secondary | ICD-10-CM

## 2020-12-17 DIAGNOSIS — A419 Sepsis, unspecified organism: Secondary | ICD-10-CM | POA: Diagnosis not present

## 2020-12-17 DIAGNOSIS — I482 Chronic atrial fibrillation, unspecified: Secondary | ICD-10-CM | POA: Diagnosis present

## 2020-12-17 DIAGNOSIS — R34 Anuria and oliguria: Secondary | ICD-10-CM | POA: Diagnosis present

## 2020-12-17 DIAGNOSIS — J939 Pneumothorax, unspecified: Secondary | ICD-10-CM

## 2020-12-17 DIAGNOSIS — R6521 Severe sepsis with septic shock: Secondary | ICD-10-CM | POA: Diagnosis present

## 2020-12-17 DIAGNOSIS — Z66 Do not resuscitate: Secondary | ICD-10-CM | POA: Diagnosis not present

## 2020-12-17 DIAGNOSIS — N179 Acute kidney failure, unspecified: Secondary | ICD-10-CM | POA: Diagnosis present

## 2020-12-17 DIAGNOSIS — A4181 Sepsis due to Enterococcus: Secondary | ICD-10-CM | POA: Diagnosis present

## 2020-12-17 DIAGNOSIS — Z515 Encounter for palliative care: Secondary | ICD-10-CM | POA: Diagnosis not present

## 2020-12-17 DIAGNOSIS — I472 Ventricular tachycardia: Secondary | ICD-10-CM | POA: Diagnosis present

## 2020-12-17 DIAGNOSIS — E875 Hyperkalemia: Secondary | ICD-10-CM | POA: Diagnosis present

## 2020-12-17 DIAGNOSIS — E870 Hyperosmolality and hypernatremia: Secondary | ICD-10-CM | POA: Diagnosis present

## 2020-12-17 DIAGNOSIS — N170 Acute kidney failure with tubular necrosis: Secondary | ICD-10-CM | POA: Diagnosis present

## 2020-12-17 DIAGNOSIS — Z789 Other specified health status: Secondary | ICD-10-CM

## 2020-12-17 DIAGNOSIS — K7200 Acute and subacute hepatic failure without coma: Secondary | ICD-10-CM | POA: Diagnosis present

## 2020-12-17 DIAGNOSIS — J9621 Acute and chronic respiratory failure with hypoxia: Secondary | ICD-10-CM | POA: Diagnosis present

## 2020-12-17 DIAGNOSIS — J189 Pneumonia, unspecified organism: Secondary | ICD-10-CM

## 2020-12-17 DIAGNOSIS — E785 Hyperlipidemia, unspecified: Secondary | ICD-10-CM | POA: Diagnosis present

## 2020-12-17 DIAGNOSIS — J9602 Acute respiratory failure with hypercapnia: Secondary | ICD-10-CM | POA: Diagnosis present

## 2020-12-17 DIAGNOSIS — R040 Epistaxis: Secondary | ICD-10-CM | POA: Diagnosis present

## 2020-12-17 DIAGNOSIS — J690 Pneumonitis due to inhalation of food and vomit: Secondary | ICD-10-CM | POA: Diagnosis present

## 2020-12-17 DIAGNOSIS — N183 Chronic kidney disease, stage 3 unspecified: Secondary | ICD-10-CM | POA: Diagnosis present

## 2020-12-17 DIAGNOSIS — U071 COVID-19: Secondary | ICD-10-CM | POA: Diagnosis not present

## 2020-12-17 DIAGNOSIS — J9600 Acute respiratory failure, unspecified whether with hypoxia or hypercapnia: Secondary | ICD-10-CM

## 2020-12-17 DIAGNOSIS — R609 Edema, unspecified: Secondary | ICD-10-CM | POA: Diagnosis not present

## 2020-12-17 DIAGNOSIS — Z1621 Resistance to vancomycin: Secondary | ICD-10-CM | POA: Diagnosis present

## 2020-12-17 DIAGNOSIS — A4189 Other specified sepsis: Secondary | ICD-10-CM | POA: Diagnosis not present

## 2020-12-17 DIAGNOSIS — Z8616 Personal history of COVID-19: Secondary | ICD-10-CM

## 2020-12-17 DIAGNOSIS — J1282 Pneumonia due to coronavirus disease 2019: Secondary | ICD-10-CM

## 2020-12-17 DIAGNOSIS — E669 Obesity, unspecified: Secondary | ICD-10-CM | POA: Diagnosis present

## 2020-12-17 DIAGNOSIS — L8990 Pressure ulcer of unspecified site, unspecified stage: Secondary | ICD-10-CM | POA: Diagnosis present

## 2020-12-17 DIAGNOSIS — R739 Hyperglycemia, unspecified: Secondary | ICD-10-CM | POA: Diagnosis present

## 2020-12-17 DIAGNOSIS — E874 Mixed disorder of acid-base balance: Secondary | ICD-10-CM | POA: Diagnosis present

## 2020-12-17 DIAGNOSIS — I130 Hypertensive heart and chronic kidney disease with heart failure and stage 1 through stage 4 chronic kidney disease, or unspecified chronic kidney disease: Secondary | ICD-10-CM | POA: Diagnosis present

## 2020-12-17 DIAGNOSIS — S270XXA Traumatic pneumothorax, initial encounter: Secondary | ICD-10-CM | POA: Diagnosis present

## 2020-12-17 DIAGNOSIS — E872 Acidosis: Secondary | ICD-10-CM | POA: Diagnosis present

## 2020-12-17 DIAGNOSIS — I5032 Chronic diastolic (congestive) heart failure: Secondary | ICD-10-CM | POA: Diagnosis present

## 2020-12-17 DIAGNOSIS — I81 Portal vein thrombosis: Secondary | ICD-10-CM

## 2020-12-17 DIAGNOSIS — B952 Enterococcus as the cause of diseases classified elsewhere: Secondary | ICD-10-CM | POA: Diagnosis not present

## 2020-12-17 DIAGNOSIS — K567 Ileus, unspecified: Secondary | ICD-10-CM | POA: Diagnosis present

## 2020-12-17 LAB — BASIC METABOLIC PANEL
Anion gap: 23 — ABNORMAL HIGH (ref 5–15)
BUN: 159 mg/dL — ABNORMAL HIGH (ref 8–23)
CO2: 21 mmol/L — ABNORMAL LOW (ref 22–32)
Calcium: 5.3 mg/dL — CL (ref 8.9–10.3)
Chloride: 103 mmol/L (ref 98–111)
Creatinine, Ser: 4.9 mg/dL — ABNORMAL HIGH (ref 0.61–1.24)
GFR, Estimated: 12 mL/min — ABNORMAL LOW (ref >60)
Glucose, Bld: 295 mg/dL — ABNORMAL HIGH (ref 70–99)
Potassium: 5.6 mmol/L — ABNORMAL HIGH (ref 3.5–5.1)
Sodium: 147 mmol/L — ABNORMAL HIGH (ref 135–145)

## 2020-12-17 LAB — COMPREHENSIVE METABOLIC PANEL
ALT: 2332 U/L — ABNORMAL HIGH (ref 0–44)
AST: 3276 U/L — ABNORMAL HIGH (ref 15–41)
Albumin: 1.5 g/dL — ABNORMAL LOW (ref 3.5–5.0)
Alkaline Phosphatase: 98 U/L (ref 38–126)
Anion gap: 21 — ABNORMAL HIGH (ref 5–15)
BUN: 160 mg/dL — ABNORMAL HIGH (ref 8–23)
CO2: 22 mmol/L (ref 22–32)
Calcium: 5.2 mg/dL — CL (ref 8.9–10.3)
Chloride: 100 mmol/L (ref 98–111)
Creatinine, Ser: 5.45 mg/dL — ABNORMAL HIGH (ref 0.61–1.24)
GFR, Estimated: 11 mL/min — ABNORMAL LOW (ref >60)
Glucose, Bld: 277 mg/dL — ABNORMAL HIGH (ref 70–99)
Potassium: 4.9 mmol/L (ref 3.5–5.1)
Sodium: 143 mmol/L (ref 135–145)
Total Bilirubin: 3.1 mg/dL — ABNORMAL HIGH (ref 0.3–1.2)
Total Protein: 4.9 g/dL — ABNORMAL LOW (ref 6.5–8.1)

## 2020-12-17 LAB — CBC
HCT: 44.1 % (ref 39.0–52.0)
HCT: 40 % (ref 39.0–52.0)
Hemoglobin: 13.9 g/dL (ref 13.0–17.0)
Hemoglobin: 12.9 g/dL — ABNORMAL LOW (ref 13.0–17.0)
MCH: 33 pg (ref 26.0–34.0)
MCH: 32.7 pg (ref 26.0–34.0)
MCHC: 31.5 g/dL (ref 30.0–36.0)
MCHC: 32.3 g/dL (ref 30.0–36.0)
MCV: 104.8 fL — ABNORMAL HIGH (ref 80.0–100.0)
MCV: 101.5 fL — ABNORMAL HIGH (ref 80.0–100.0)
Platelets: DECREASED 10*3/uL (ref 150–400)
Platelets: 56 10*3/uL — ABNORMAL LOW (ref 150–400)
RBC: 4.21 MIL/uL — ABNORMAL LOW (ref 4.22–5.81)
RBC: 3.94 MIL/uL — ABNORMAL LOW (ref 4.22–5.81)
RDW: 14.8 % (ref 11.5–15.5)
RDW: 14.7 % (ref 11.5–15.5)
WBC: 30.9 10*3/uL — ABNORMAL HIGH (ref 4.0–10.5)
WBC: 32.1 10*3/uL — ABNORMAL HIGH (ref 4.0–10.5)
nRBC: 0.2 % (ref 0.0–0.2)
nRBC: 0.2 % (ref 0.0–0.2)

## 2020-12-17 LAB — CORTISOL: Cortisol, Plasma: 9.9 ug/dL

## 2020-12-17 LAB — POCT I-STAT 7, (LYTES, BLD GAS, ICA,H+H)
Acid-base deficit: 2 mmol/L (ref 0.0–2.0)
Acid-base deficit: 3 mmol/L — ABNORMAL HIGH (ref 0.0–2.0)
Bicarbonate: 25.9 mmol/L (ref 20.0–28.0)
Bicarbonate: 25.4 mmol/L (ref 20.0–28.0)
Calcium, Ion: 0.68 mmol/L — CL (ref 1.15–1.40)
Calcium, Ion: 0.74 mmol/L — CL (ref 1.15–1.40)
HCT: 37 % — ABNORMAL LOW (ref 39.0–52.0)
HCT: 40 % (ref 39.0–52.0)
Hemoglobin: 12.6 g/dL — ABNORMAL LOW (ref 13.0–17.0)
Hemoglobin: 13.6 g/dL (ref 13.0–17.0)
O2 Saturation: 78 %
O2 Saturation: 82 %
Patient temperature: 98.6
Potassium: 4.5 mmol/L (ref 3.5–5.1)
Potassium: 4.3 mmol/L (ref 3.5–5.1)
Sodium: 141 mmol/L (ref 135–145)
Sodium: 142 mmol/L (ref 135–145)
TCO2: 28 mmol/L (ref 22–32)
TCO2: 27 mmol/L (ref 22–32)
pCO2 arterial: 57.6 mmHg — ABNORMAL HIGH (ref 32.0–48.0)
pCO2 arterial: 56.5 mmHg — ABNORMAL HIGH (ref 32.0–48.0)
pH, Arterial: 7.26 — ABNORMAL LOW (ref 7.350–7.450)
pH, Arterial: 7.261 — ABNORMAL LOW (ref 7.350–7.450)
pO2, Arterial: 50 mmHg — ABNORMAL LOW (ref 83.0–108.0)
pO2, Arterial: 55 mmHg — ABNORMAL LOW (ref 83.0–108.0)

## 2020-12-17 LAB — GLUCOSE, CAPILLARY
Glucose-Capillary: 160 mg/dL — ABNORMAL HIGH (ref 70–99)
Glucose-Capillary: 160 mg/dL — ABNORMAL HIGH (ref 70–99)
Glucose-Capillary: 225 mg/dL — ABNORMAL HIGH (ref 70–99)

## 2020-12-17 LAB — RENAL FUNCTION PANEL
Albumin: 1.5 g/dL — ABNORMAL LOW (ref 3.5–5.0)
Anion gap: 22 — ABNORMAL HIGH (ref 5–15)
BUN: 162 mg/dL — ABNORMAL HIGH (ref 8–23)
CO2: 22 mmol/L (ref 22–32)
Calcium: 5.4 mg/dL — CL (ref 8.9–10.3)
Chloride: 100 mmol/L (ref 98–111)
Creatinine, Ser: 5.44 mg/dL — ABNORMAL HIGH (ref 0.61–1.24)
GFR, Estimated: 11 mL/min — ABNORMAL LOW (ref >60)
Glucose, Bld: 191 mg/dL — ABNORMAL HIGH (ref 70–99)
Phosphorus: 11.5 mg/dL — ABNORMAL HIGH (ref 2.5–4.6)
Potassium: 4.9 mmol/L (ref 3.5–5.1)
Sodium: 144 mmol/L (ref 135–145)

## 2020-12-17 LAB — BLOOD GAS, ARTERIAL
Acid-base deficit: 3 mmol/L — ABNORMAL HIGH (ref 0.0–2.0)
Bicarbonate: 23.3 mmol/L (ref 20.0–28.0)
FIO2: 100
O2 Saturation: 81.8 %
Patient temperature: 37
pCO2 arterial: 56.8 mmHg — ABNORMAL HIGH (ref 32.0–48.0)
pH, Arterial: 7.238 — ABNORMAL LOW (ref 7.350–7.450)
pO2, Arterial: 51.9 mmHg — ABNORMAL LOW (ref 83.0–108.0)

## 2020-12-17 LAB — HIV ANTIBODY (ROUTINE TESTING W REFLEX): HIV Screen 4th Generation wRfx: NONREACTIVE

## 2020-12-17 LAB — COOXEMETRY PANEL
Carboxyhemoglobin: 0.8 % (ref 0.5–1.5)
Methemoglobin: 1.3 % (ref 0.0–1.5)
O2 Saturation: 79.1 %
Total hemoglobin: 11.9 g/dL — ABNORMAL LOW (ref 12.0–16.0)

## 2020-12-17 LAB — MAGNESIUM: Magnesium: 2.8 mg/dL — ABNORMAL HIGH (ref 1.7–2.4)

## 2020-12-17 LAB — DIC (DISSEMINATED INTRAVASCULAR COAGULATION)PANEL
D-Dimer, Quant: 13.1 ug/mL-FEU — ABNORMAL HIGH (ref 0.00–0.50)
Fibrinogen: 254 mg/dL (ref 210–475)
INR: 6.3 (ref 0.8–1.2)
Platelets: 58 10*3/uL — ABNORMAL LOW (ref 150–400)
Prothrombin Time: 53.6 seconds — ABNORMAL HIGH (ref 11.4–15.2)
Smear Review: NONE SEEN
aPTT: 37 seconds — ABNORMAL HIGH (ref 24–36)

## 2020-12-17 LAB — HEPATITIS PANEL, ACUTE
HCV Ab: NONREACTIVE
Hep A IgM: NONREACTIVE
Hep B C IgM: NONREACTIVE
Hepatitis B Surface Ag: NONREACTIVE

## 2020-12-17 LAB — LACTIC ACID, PLASMA
Lactic Acid, Venous: 4.9 mmol/L (ref 0.5–1.9)
Lactic Acid, Venous: 4.7 mmol/L (ref 0.5–1.9)
Lactic Acid, Venous: 4.1 mmol/L (ref 0.5–1.9)

## 2020-12-17 LAB — ECHOCARDIOGRAM COMPLETE: S' Lateral: 2.4 cm

## 2020-12-17 LAB — BRAIN NATRIURETIC PEPTIDE: B Natriuretic Peptide: 111.6 pg/mL — ABNORMAL HIGH (ref 0.0–100.0)

## 2020-12-17 LAB — HEMOGLOBIN A1C
Hgb A1c MFr Bld: 6.8 % — ABNORMAL HIGH (ref 4.8–5.6)
Mean Plasma Glucose: 148.46 mg/dL

## 2020-12-17 MED ORDER — NOREPINEPHRINE 4 MG/250ML-% IV SOLN: 5.0000 ug/min | INTRAVENOUS | Status: DC

## 2020-12-17 MED ORDER — POLYETHYLENE GLYCOL 3350 17 G PO PACK: 17.0000 g | PACK | Freq: Every day | ORAL | Status: DC | PRN

## 2020-12-17 MED ORDER — VECURONIUM BROMIDE 10 MG IV SOLR
INTRAVENOUS | Status: AC
  Filled 2020-12-17: qty 10

## 2020-12-17 MED ORDER — ONDANSETRON HCL 4 MG/2ML IJ SOLN: 4.0000 mg | Freq: Four times a day (QID) | INTRAMUSCULAR | Status: DC | PRN

## 2020-12-17 MED ORDER — MIDAZOLAM HCL 2 MG/2ML IJ SOLN: 1.0000 mg | INTRAMUSCULAR | Status: DC | PRN

## 2020-12-17 MED ORDER — DOCUSATE SODIUM 100 MG PO CAPS
100.0000 mg | ORAL_CAPSULE | Freq: Two times a day (BID) | ORAL | Status: DC | PRN
Start: 2020-12-17 — End: 2020-12-19

## 2020-12-17 MED ORDER — DOCUSATE SODIUM 50 MG/5ML PO LIQD
100.0000 mg | Freq: Two times a day (BID) | ORAL | Status: DC
  Administered 2020-12-18 – 2020-12-19 (×3): 100 mg
  Filled 2020-12-17 (×4): qty 10

## 2020-12-17 MED ORDER — ACETAMINOPHEN 325 MG PO TABS: 650.0000 mg | ORAL_TABLET | ORAL | Status: DC | PRN

## 2020-12-17 MED ORDER — PROPOFOL 1000 MG/100ML IV EMUL
5.0000 ug/kg/min | INTRAVENOUS | Status: DC
  Administered 2020-12-17 – 2020-12-18 (×2): 20 ug/kg/min via INTRAVENOUS
  Administered 2020-12-18 (×2): 10 ug/kg/min via INTRAVENOUS
  Filled 2020-12-17 (×5): qty 100

## 2020-12-17 MED ORDER — PROSOURCE TF PO LIQD
90.0000 mL | Freq: Three times a day (TID) | ORAL | Status: DC
  Administered 2020-12-17 – 2020-12-19 (×5): 90 mL
  Filled 2020-12-17 (×5): qty 90

## 2020-12-17 MED ORDER — PRISMASOL BGK 0/2.5 32-2.5 MEQ/L EC SOLN
Status: DC
  Filled 2020-12-17 (×7): qty 5000

## 2020-12-17 MED ORDER — POLYETHYLENE GLYCOL 3350 17 G PO PACK
17.0000 g | PACK | Freq: Every day | ORAL | Status: DC
  Administered 2020-12-18 – 2020-12-19 (×2): 17 g
  Filled 2020-12-17 (×2): qty 1

## 2020-12-17 MED ORDER — VANCOMYCIN VARIABLE DOSE PER UNSTABLE RENAL FUNCTION (PHARMACIST DOSING): Status: DC

## 2020-12-17 MED ORDER — CALCIUM GLUCONATE-NACL 2-0.675 GM/100ML-% IV SOLN
2.0000 g | Freq: Once | INTRAVENOUS | Status: AC
  Administered 2020-12-17: 2000 mg via INTRAVENOUS
  Filled 2020-12-17: qty 100

## 2020-12-17 MED ORDER — VASOPRESSIN 20 UNITS/100 ML INFUSION FOR SHOCK
0.0000 [IU]/min | INTRAVENOUS | Status: DC
  Administered 2020-12-17 – 2020-12-18 (×4): 0.03 [IU]/min via INTRAVENOUS
  Administered 2020-12-19 (×2): 0.04 [IU]/min via INTRAVENOUS
  Filled 2020-12-17 (×6): qty 100

## 2020-12-17 MED ORDER — PRISMASOL BGK 4/2.5 32-4-2.5 MEQ/L EC SOLN
Status: DC
  Filled 2020-12-17 (×13): qty 5000

## 2020-12-17 MED ORDER — NOREPINEPHRINE 4 MG/250ML-% IV SOLN
INTRAVENOUS | Status: AC
  Filled 2020-12-17: qty 250

## 2020-12-17 MED ORDER — PIPERACILLIN-TAZOBACTAM IN DEX 2-0.25 GM/50ML IV SOLN
2.2500 g | Freq: Three times a day (TID) | INTRAVENOUS | Status: DC
  Filled 2020-12-17 (×2): qty 50

## 2020-12-17 MED ORDER — FENTANYL BOLUS VIA INFUSION
25.0000 ug | INTRAVENOUS | Status: DC | PRN
Start: 2020-12-17 — End: 2020-12-19
  Administered 2020-12-17 – 2020-12-19 (×5): 25 ug via INTRAVENOUS
  Filled 2020-12-17: qty 25

## 2020-12-17 MED ORDER — VANCOMYCIN HCL 1500 MG/300ML IV SOLN: 1500.0000 mg | INTRAVENOUS | Status: DC

## 2020-12-17 MED ORDER — NOREPINEPHRINE 16 MG/250ML-% IV SOLN
5.0000 ug/min | INTRAVENOUS | Status: DC
  Administered 2020-12-17: 36 ug/min via INTRAVENOUS
  Administered 2020-12-17: 20 ug/min via INTRAVENOUS
  Administered 2020-12-18 (×3): 50 ug/min via INTRAVENOUS
  Administered 2020-12-18: 46 ug/min via INTRAVENOUS
  Administered 2020-12-19 (×3): 50 ug/min via INTRAVENOUS
  Filled 2020-12-17 (×9): qty 250

## 2020-12-17 MED ORDER — HEPARIN SODIUM (PORCINE) 1000 UNIT/ML DIALYSIS
1000.0000 [IU] | INTRAMUSCULAR | Status: DC | PRN
  Filled 2020-12-17: qty 6

## 2020-12-17 MED ORDER — VANCOMYCIN HCL 10 G IV SOLR
2500.0000 mg | Freq: Once | INTRAVENOUS | Status: AC
  Administered 2020-12-17: 2500 mg via INTRAVENOUS
  Filled 2020-12-17: qty 2500

## 2020-12-17 MED ORDER — SODIUM CHLORIDE 0.9 % IV SOLN
250.0000 mL | INTRAVENOUS | Status: DC
  Administered 2020-12-17: 250 mL via INTRAVENOUS

## 2020-12-17 MED ORDER — FENTANYL 2500MCG IN NS 250ML (10MCG/ML) PREMIX INFUSION
25.0000 ug/h | INTRAVENOUS | Status: DC
  Administered 2020-12-17: 100 ug/h via INTRAVENOUS
  Administered 2020-12-18 (×2): 150 ug/h via INTRAVENOUS
  Filled 2020-12-17 (×3): qty 250

## 2020-12-17 MED ORDER — VECURONIUM BROMIDE 10 MG IV SOLR
10.0000 mg | INTRAVENOUS | Status: DC | PRN
  Administered 2020-12-17 – 2020-12-18 (×4): 10 mg via INTRAVENOUS
  Filled 2020-12-17 (×4): qty 10

## 2020-12-17 MED ORDER — INSULIN ASPART 100 UNIT/ML ~~LOC~~ SOLN
0.0000 [IU] | SUBCUTANEOUS | Status: DC
  Administered 2020-12-17 – 2020-12-18 (×3): 3 [IU] via SUBCUTANEOUS

## 2020-12-17 MED ORDER — PIPERACILLIN-TAZOBACTAM 3.375 G IVPB 30 MIN
3.3750 g | Freq: Four times a day (QID) | INTRAVENOUS | Status: DC
  Administered 2020-12-17 – 2020-12-19 (×8): 3.375 g via INTRAVENOUS
  Filled 2020-12-17 (×9): qty 50

## 2020-12-17 MED ORDER — SODIUM BICARBONATE 8.4 % IV SOLN
INTRAVENOUS | Status: DC
  Filled 2020-12-17 (×4): qty 850
  Filled 2020-12-17: qty 150
  Filled 2020-12-17 (×2): qty 850

## 2020-12-17 MED ORDER — HEPARIN (PORCINE) 2000 UNITS/L FOR CRRT
INTRAVENOUS_CENTRAL | Status: DC | PRN
  Filled 2020-12-17: qty 1000

## 2020-12-17 MED ORDER — PANTOPRAZOLE SODIUM 40 MG IV SOLR
40.0000 mg | Freq: Every day | INTRAVENOUS | Status: DC
  Administered 2020-12-17 – 2020-12-18 (×2): 40 mg via INTRAVENOUS
  Filled 2020-12-17 (×2): qty 40

## 2020-12-17 MED ORDER — HYDROCORTISONE NA SUCCINATE PF 100 MG IJ SOLR
50.0000 mg | Freq: Four times a day (QID) | INTRAMUSCULAR | Status: DC
  Administered 2020-12-17 – 2020-12-19 (×9): 50 mg via INTRAVENOUS
  Filled 2020-12-17 (×9): qty 2

## 2020-12-17 MED ORDER — ARTIFICIAL TEARS OPHTHALMIC OINT
1.0000 "application " | TOPICAL_OINTMENT | Freq: Three times a day (TID) | OPHTHALMIC | Status: DC
  Administered 2020-12-17 – 2020-12-19 (×6): 1 via OPHTHALMIC
  Filled 2020-12-17 (×2): qty 3.5

## 2020-12-17 MED ORDER — PROPOFOL 1000 MG/100ML IV EMUL
INTRAVENOUS | Status: AC
  Administered 2020-12-17: 10 ug/kg/min via INTRAVENOUS
  Filled 2020-12-17: qty 100

## 2020-12-17 MED ORDER — VITAMIN K1 10 MG/ML IJ SOLN
5.0000 mg | Freq: Once | INTRAVENOUS | Status: AC
  Administered 2020-12-17: 5 mg via INTRAVENOUS
  Filled 2020-12-17: qty 0.5

## 2020-12-17 MED ORDER — FENTANYL CITRATE (PF) 100 MCG/2ML IJ SOLN
25.0000 ug | Freq: Once | INTRAMUSCULAR | Status: DC
Start: 2020-12-17 — End: 2020-12-19

## 2020-12-17 MED ORDER — VITAL 1.5 CAL PO LIQD: 1000.0000 mL | ORAL | Status: DC

## 2020-12-17 MED ORDER — PRISMASOL BGK 0/2.5 32-2.5 MEQ/L EC SOLN
Status: DC
  Filled 2020-12-17 (×6): qty 5000

## 2020-12-17 MED ORDER — LACTATED RINGERS IV SOLN: INTRAVENOUS | Status: DC

## 2020-12-17 NOTE — Progress Notes (Signed)
eLink Physician-Brief Progress Note Patient Name: Matthew Oliver DOB: 10/23/1951 MRN: [id number]   Date of Service  01/10/2021  HPI/Events of Note  Patient with acute hypoxemic respiratory failure secondary to Covid pneumonia complicated by volume overload and suspected bacterial pneumonia.He has a recent pneumothorax and saturation is 87 % on a 100 % FIO2.  eICU Interventions  Saturation goal for now is 86 % to 92 %, as it is inadvisable to increase PEEP given recent pneumothorax, will check a stat portable CXR, and an ABG. CRRT is in progress.        [name] 12/30/2020, 9:15 PM

## 2020-12-17 NOTE — Procedures (Signed)
Arterial Catheter Insertion Procedure Note Matthew Oliver [id number] Apr 14, 1951  Procedure: Insertion of Arterial Catheter  Indications: Blood pressure monitoring  Procedure Details Consent: Risks of procedure as well as the alternatives and risks of each were explained to the (patient/caregiver).  Consent for procedure obtained. Time Out: Verified patient identification, verified procedure, site/side was marked, verified correct patient position, special equipment/implants available, medications/allergies/relevent history reviewed, required imaging and test results available.  Performed  Maximum sterile technique was used including antiseptics, cap, gloves, gown, hand hygiene, mask and sheet. Skin prep: Chlorhexidine; local anesthetic administered 20 gauge catheter was inserted into right femoral artery using the Seldinger technique.  Evaluation Blood flow good; BP tracing good. Complications: No apparent complications.   Matthew Oliver ACNP Matthew Oliver PCCM Pager [phone] till 3 pm If no answer page [phone] 01/06/2021, 1:00 PM

## 2020-12-17 NOTE — Procedures (Signed)
  Hemodialysis Insertion Procedure Note Matthew Oliver [id number] 08/19/1951  Procedure: Insertion of Hemodialysis Catheter Type: 3 port  Indications: Hemodialysis   Procedure Details Consent: Risks of procedure as well as the alternatives and risks of each were explained to the (patient/caregiver).  Consent for procedure obtained. Time Out: Verified patient identification, verified procedure, site/side was marked, verified correct patient position, special equipment/implants available, medications/allergies/relevent history reviewed, required imaging and test results available.  Performed  Maximum sterile technique was used including antiseptics, cap, gloves, gown, hand hygiene, mask and sheet. Skin prep: Chlorhexidine; local anesthetic administered A antimicrobial bonded/coated triple lumen catheter was placed in the right femoral vein due to emergent situation using the Seldinger technique. Ultrasound guidance used.Yes.   Catheter placed to 20 cm. Blood aspirated via all 3 ports and then flushed x 3. Line sutured x 2 and dressing applied.  Evaluation Blood flow good Complications: No apparent complications Patient did tolerate procedure well. Chest X-ray ordered to verify placement.  CXR:not needed  Matthew Oliver ACNP Acute Care Nurse Practitioner [facility] Pulmonary/Critical Care Please consult Amion 01/06/2021, 1:02 PM

## 2020-12-17 NOTE — H&P (Addendum)
NAME:  Matthew Oliver, MRN:  [id number], DOB:  09/18/1951, LOS: 0 ADMISSION DATE:  12-28-2020, CONSULTATION DATE:  2/4 REFERRING MD:  [facility], CHIEF COMPLAINT:  Renal failure, shock  Brief History:  70 yo M from [facility] following COVID19 PNA who has hypoxemic respiratory failure, ptx (s/p pigtail), renal failure, and shock (likely septic)   History of Present Illness:  70 yo M PMH COVID-19 PNA, HTN, HLD who has been at [facility] following COVID-19 PNA (postive date 1/14), transferred to MICU 2/4 with worsening renal failure, shock, and progressive hypoxemia on MV. Previously pt DNR. Notably, pt to ED 2/3 with ptx and R pigtail was placed at that time. 2/3 [facility] ED[facility] also [facility] at [facility] concerns  About pt new pressor requirement, but pt taken back to [facility] per [facility].  In review of labs, looks like 2/3 pt had new hyperkalemia and AKI with Cr 3.4 from 1.3.  On 2/4 the patient had worsening renal failure and worsening shock, which prompted transfer from [facility] to ICU.   Past Medical History:  COVID 19 PNA Afib Acute on chronic respiratory failure HTN HLD  Significant Hospital Events:  2/4 transferred to [facility]U from [facility]. Renal failure, worsening shock. Intubated, on high vent settings and SpO2 88%  Consults:  nephro  Procedures:  2/3 R pigtail (placed in ED) 2/4 CVC R IJ (placed at [facility])  2/4 HD cath   Significant Diagnostic Tests:  2/4 CXR >>  Micro Data:  Prior COVID +  2/4 BCx> 2/4 Tracheal aspirate>  Antimicrobials:  2/4 vanc> 2/4 zosyn>  Interim History / Subjective:  Arrives to ICU from [facility]   Objective   There were no vitals taken for this visit.       No intake or output data in the 24 hours ending Dec 28, 2020 1233 There were no vitals filed for this visit.  Examination: General: Critically ill appearing older adult M, intubated sedated NAD HENT: ETT secure. Bilateral nares packed and saturated with blood. Trachea midline  Lungs: R  chest tube. Mechanically ventilated. Rhonchi Cardiovascular: rrr sluggish cap refill s1s2 Abdomen: round nd nt Extremities:  Neuro: Sedated. Not following commands. GU: normal external anatomy  Resolved Hospital Problem list     Assessment & Plan:   Acute respiratory failure with hypoxemia and hypercarbia requiring MV -pt recently reversed code status R PTX s/p chest tube placement 2/3 Recent COVID-19 PNA P -CXR and ABG in ICU after HD cath placed -Full MV support -VAP, PAD -routine chest tube management  Shock, suspect septic shock, with evidence of end organ dysfunction  (kidneys, lungs)  Lactic acidosis P -BCx, Tracheal aspirate -empiric vanc, zosyn  -recheck CBC and add DIC panel  -checking BNP, cortisol and ordering ECHO as well   Epistaxis, with ongoing bleeding -likely r/t trauma from NGT attempts + anti coag for Afib. Not sure when last dose was -ongoing bleeding likely worsened due to uremia, but possible r/t septic shock if DIC panel is revealing P -rhino rockets  -recheck CBC + DIC panel -uremia as below  Acute renal failure with anuria and uremia  -seen by neprho at [facility] prior to transfer -renal US 2/4 without etiology for renal failure Hyperkalemia AGMA P -placing HD cath in ICU -Call neprho for CRRT when complete -rechecking BMP   -continue bicarb gtt for now   Hx Afib -ICU monitoring -optimize electrolytes -not on hep gtt -- ongoing epistaxis, awaiting DIC panel and repeat CBC  Hypocalcemia Hypernatremia, mild P -replace  Ca, cont to trend   Hyperglycemia P -SSI  Best practice (evaluated daily)  Diet: npo Pain/Anxiety/Delirium protocol (if indicated): fent, PRN versed VAP protocol (if indicated): yes DVT prophylaxis: SCD GI prophylaxis: protonix  Glucose control: SSI Mobility: BR Disposition:ICU  Goals of Care:  Last date of multidisciplinary goals of care discussion: 2/4 family updated upon arrival to MICU. Pt reversed code  status from DNR to Full at [facility], and was intubated in this setting. Had apparently also articulated "wanting everything" Family and staff present: Dr Katrinka Blazing PCCM Summary of discussion:  Full scope Follow up goals of care discussion due: 2/11 Code Status: Fell   Labs   CBC: Recent Labs  Lab 12/13/20 1048 12/16/20 0516 12/16/20 0922 12/18/2020 0509  WBC 16.1* 19.2*  --  30.9*  HGB 16.3 18.4* 16.0 13.9  HCT 50.3 62.2* 47.0 44.1  MCV 96.9 107.4*  --  104.8*  PLT 248 181  --  PLATELET CLUMPS NOTED ON SMEAR, COUNT APPEARS DECREASED    Basic Metabolic Panel: Recent Labs  Lab 12/12/20 0435 12/13/20 1048 12/16/20 0516 12/16/20 0922 12/16/20 1425 12/21/2020 0509  NA 142 149* 154* 154* 147* 147*  K 4.0 3.5 5.6* 4.9 6.1* 5.6*  CL 99 105 107  --  104 103  CO2 28 28 25   --  19* 21*  GLUCOSE 153* 163* 153*  --  356* 295*  BUN 51* 69* 137*  --  136* 159*  CREATININE 1.29* 1.33* 3.41*  --  3.99* 4.90*  CALCIUM 8.4* 8.2* 8.3*  --  6.9* 5.3*   GFR: Estimated Creatinine Clearance: 18.5 mL/min (A) (by C-G formula based on SCr of 4.9 mg/dL (H)). Recent Labs  Lab 12/13/20 1048 12/16/20 0516 12/16/20 1425 12/16/20 2127 12/24/2020 0509  WBC 16.1* 19.2*  --   --  30.9*  LATICACIDVEN  --   --  5.2* 6.0* 4.9*    Liver Function Tests: No results for input(s): AST, ALT, ALKPHOS, BILITOT, PROT, ALBUMIN in the last 168 hours. No results for input(s): LIPASE, AMYLASE in the last 168 hours. No results for input(s): AMMONIA in the last 168 hours.  ABG    Component Value Date/Time   PHART 7.238 (L) 01/01/2021 0924   PCO2ART 56.8 (H) 01/06/2021 0924   PO2ART 51.9 (L) 01/08/2021 0924   HCO3 23.3 12/23/2020 0924   TCO2 25 12/16/2020 0922   ACIDBASEDEF 3.0 (H) 01/05/2021 0924   O2SAT 81.8 01/10/2021 0924     Coagulation Profile: No results for input(s): INR, PROTIME in the last 168 hours.  Cardiac Enzymes: No results for input(s): CKTOTAL, CKMB, CKMBINDEX, TROPONINI in the last 168  hours.  HbA1C: No results found for: HGBA1C  CBG: No results for input(s): GLUCAP in the last 168 hours.  Review of Systems:   Unable to obtain, intubated and sedated   Past Medical History:  He,  has a past medical history of Acute on chronic respiratory failure with hypoxia (HCC), Chronic atrial fibrillation (HCC), Community acquired pneumonia of both lower lobes, COVID-19 virus infection, and Pneumonia due to COVID-19 virus.   Surgical History:  Unavailable   Social History:      Family History:  His family history is not on file.   Allergies Not on File   Home Medications  Prior to Admission medications   Not on File     Critical care time: 61 min      CRITICAL CARE Performed by: 02/14/2021   Total critical care time: 61 minutes  Critical care time was exclusive of separately billable procedures and treating other patients. Critical care was necessary to treat or prevent imminent or life-threatening deterioration.  Critical care was time spent personally by me on the following activities: development of treatment plan with patient and/or surrogate as well as nursing, discussions with consultants, evaluation of patient's response to treatment, examination of patient, obtaining history from patient or surrogate, ordering and performing treatments and interventions, ordering and review of laboratory studies, ordering and review of radiographic studies, pulse oximetry and re-evaluation of patient's condition.  Tessie Fass MSN, AGACNP-BC [place] [phone] If no answer, [phone] 12/16/2020, 1:10 PM

## 2020-12-17 NOTE — Procedures (Signed)
Nasal packing Indication: ongoing epistaxis Consent- emergent Existing 4x4s removed and rhino rockets advanced into both nares  No complications Myrla Halsted MD PCCM

## 2020-12-17 NOTE — Progress Notes (Signed)
  Echocardiogram 2D Echocardiogram  has been performed.  Leta Jungling M 01/09/2021, 2:53 PM

## 2020-12-17 NOTE — Progress Notes (Signed)
eLink Physician-Brief Progress Note Patient Name: Matthew Oliver DOB: Feb 19, 1951 MRN: [id number]   Date of Service  January 08, 2021  HPI/Events of Note  Patient had a 6 beat run of wide-complex tachycardia with spontaneous termination, no hemodynamic compromise.  eICU Interventions  Will check BMP, Mg++ scheduled for 0050 tomorrow now.        Thomasene Lot Ogan 01/08/21, 11:54 PM

## 2020-12-17 NOTE — Progress Notes (Signed)
Pulmonary Critical Care Medicine [facility] [place]   PULMONARY CRITICAL CARE SERVICE  PROGRESS NOTE  Date of Service: 01-07-2021  Matthew Oliver  MPN:[id number]  DOB: 1951-04-02   DOA: 12/16/2020  Referring Physician: Carron Curie, MD  HPI: Matthew Oliver is a 70 y.o. male seen for follow up of Acute on Chronic Respiratory Failure.  Patient is not doing well as expected.  The patient was intubated based on his own request and he subsequently developed pneumothorax taken down to the emergency department for chest tube placement.  Now patient is basically in DIC he has renal shutdown.  He had previously been DNR he had rescinded his orders and wanted to be intubated even though we had explained to them that his prognosis is ongoing mechanical ventilation would not be good.  Primary care team is going to speak with the family regarding goals of care and CODE STATUS once again  Medications: Reviewed on Rounds  Physical Exam:  Vitals: Temperature is 96.2 pulse 101 respiratory rate 34 blood pressure is 163/59 saturations 90%  Ventilator Settings on pressure assist control FiO2 is 100% PEEP of 8 IP 22  . General: Comfortable at this time . Eyes: Grossly normal lids, irises & conjunctiva . ENT: grossly tongue is normal . Neck: no obvious mass . Cardiovascular: S1 S2 normal no gallop . Respiratory: Scattered rhonchi expansion is equal coarse . Abdomen: soft . Skin: no rash seen on limited exam . Musculoskeletal: not rigid . Psychiatric:unable to assess . Neurologic: no seizure no involuntary movements         Lab Data:   Basic Metabolic Panel: Recent Labs  Lab 12/12/20 0435 12/13/20 1048 12/16/20 0516 12/16/20 0922 12/16/20 1425 2021-01-07 0509  NA 142 149* 154* 154* 147* 147*  K 4.0 3.5 5.6* 4.9 6.1* 5.6*  CL 99 105 107  --  104 103  CO2 28 28 25   --  19* 21*  GLUCOSE 153* 163* 153*  --  356* 295*  BUN 51* 69* 137*  --  136* 159*  CREATININE 1.29* 1.33* 3.41*  --   3.99* 4.90*  CALCIUM 8.4* 8.2* 8.3*  --  6.9* 5.3*    ABG: Recent Labs  Lab 12/15/20 1700 12/16/20 0101 12/16/20 0922 12/16/20 1429 07-Jan-2021 0924  PHART 7.283* 7.207* 7.091* 7.183* 7.238*  PCO2ART 64.4* 78.6* 72.8* 61.4* 56.8*  PO2ART 63.2* 48.9* 91 53.6* 51.9*  HCO3 29.8* 30.4* 22.4 22.6 23.3  O2SAT 87.0 75.0 93.0 84.1 81.8    Liver Function Tests: No results for input(s): AST, ALT, ALKPHOS, BILITOT, PROT, ALBUMIN in the last 168 hours. No results for input(s): LIPASE, AMYLASE in the last 168 hours. No results for input(s): AMMONIA in the last 168 hours.  CBC: Recent Labs  Lab 12/13/20 1048 12/16/20 0516 12/16/20 0922 01/07/2021 0509  WBC 16.1* 19.2*  --  30.9*  HGB 16.3 18.4* 16.0 13.9  HCT 50.3 62.2* 47.0 44.1  MCV 96.9 107.4*  --  104.8*  PLT 248 181  --  PLATELET CLUMPS NOTED ON SMEAR, COUNT APPEARS DECREASED    Cardiac Enzymes: No results for input(s): CKTOTAL, CKMB, CKMBINDEX, TROPONINI in the last 168 hours.  BNP (last 3 results) No results for input(s): BNP in the last 8760 hours.  ProBNP (last 3 results) No results for input(s): PROBNP in the last 8760 hours.  Radiological Exams: DG Abd 1 View  Result Date: 12/16/2020 CLINICAL DATA:  Orogastric tube placement. EXAM: ABDOMEN - 1 VIEW COMPARISON:  December 09, 2020. FINDINGS: The  bowel gas pattern is normal. Distal tip of nasogastric tube appears to be in the distal stomach. No radio-opaque calculi or other significant radiographic abnormality are seen. IMPRESSION: Distal tip of nasogastric tube appears to be in the distal stomach. Electronically Signed   By: Lupita Raider M.D.   On: 12/16/2020 13:24   US RENAL  Result Date: Jan 06, 2021 CLINICAL DATA:  Acute renal injury EXAM: RENAL / URINARY TRACT ULTRASOUND COMPLETE COMPARISON:  None. FINDINGS: Right Kidney: Renal measurements: 12.7 x 6.0 x 6.3 cm. = volume: 250 mL. Echogenicity within normal limits. No mass or hydronephrosis visualized. Left Kidney: Renal  measurements: 13.9 x 5.6 x 5.8 cm. = volume: 234 mL. Echogenicity within normal limits. No mass or hydronephrosis visualized. Bladder: Decompressed by Foley catheter Other: None. IMPRESSION: Unremarkable kidneys. Electronically Signed   By: Alcide Clever M.D.   On: 2021/01/06 01:30   DG Chest Port 1 View  Result Date: 2021-01-06 CLINICAL DATA:  Hypoxemia EXAM: PORTABLE CHEST 1 VIEW COMPARISON:  Yesterday FINDINGS: Endotracheal tube with tip just below the clavicular heads. An enteric tube reaches the stomach. Right-sided chest tube in place. Artifact from EKG leads. Cardiomegaly and confluent airspace disease. No visible effusion or pneumothorax. IMPRESSION: 1. Unremarkable hardware. 2. Unchanged extensive airspace disease. Electronically Signed   By: Marnee Spring M.D.   On: 2021-01-06 06:10   DG Chest Portable 1 View  Result Date: 12/16/2020 CLINICAL DATA:  Hypoxia. EXAM: PORTABLE CHEST 1 VIEW COMPARISON:  Chest x-ray from same day at 0809 hours. FINDINGS: Endotracheal tube tip 6.1 cm above the carina. Interval retraction of the feeding tube with the tip now in the upper esophagus. Unchanged right-sided chest tube. No residual pneumothorax. Extensive bilateral interstitial and hazy airspace opacities are unchanged. No large pleural effusion. Stable cardiomediastinal silhouette. IMPRESSION: 1. Interval retraction of the feeding tube with the tip now in the upper esophagus. Recommend repositioning. 2. Unchanged multifocal pneumonia. Electronically Signed   By: Obie Dredge M.D.   On: 12/16/2020 11:14   DG Chest Portable 1 View  Result Date: 12/16/2020 CLINICAL DATA:  RIGHT pneumothorax post thoracostomy tube insertion, hypoxia, COVID-19 EXAM: PORTABLE CHEST 1 VIEW COMPARISON:  Portable exam 0809 hours compared to 0557 hours FINDINGS: Tip of endotracheal tube projects 6.9 cm above carina. Feeding tube extends into abdomen. New RIGHT pigtail thoracostomy tube. Near-complete of previously identified RIGHT  pneumothorax, tiny residual at lung base. Diffuse BILATERAL pulmonary infiltrates consistent with multifocal pneumonia and COVID-19. IMPRESSION: Near complete resolution of RIGHT pneumothorax following chest tube insertion. Persistent diffuse BILATERAL pulmonary infiltrates consistent with multifocal pneumonia and COVID-19. Electronically Signed   By: Ulyses Southward M.D.   On: 12/16/2020 08:21   DG CHEST PORT 1 VIEW  Result Date: 12/16/2020 CLINICAL DATA:  Intubation.  Hypoxia.  COVID. EXAM: PORTABLE CHEST 1 VIEW COMPARISON:  Chest x-ray 12/15/2020. FINDINGS: Endotracheal tube noted stable position. Feeding tube noted with tip in the upper most portion of the stomach. Heart size stable. New moderate sized right pneumothorax noted. Diffuse bilateral pulmonary infiltrates again noted. No prominent pleural effusion. Costophrenic angles incompletely imaged. Small metallic fragments again noted over the right chest. IMPRESSION: 1. Endotracheal tube in stable position. Feeding tube noted with tip in the upper most portion of the stomach. 2. New moderate-sized right pneumothorax. 3. Diffuse bilateral pulmonary infiltrates again noted. Critical Value/emergent results were called by telephone at the time of interpretation on 12/16/2020 at 6:09 am to nurse Morrie Sheldon, who verbally acknowledged these results. Electronically Signed  ByMaisie Fus  Register   On: 12/16/2020 06:10   DG Chest Port 1 View  Result Date: 12/15/2020 CLINICAL DATA:  Intubation. EXAM: PORTABLE CHEST 1 VIEW COMPARISON:  12/15/2020 FINDINGS: The endotracheal tube terminates at the thoracic inlet, approximately 8 cm above the carina. The enteric tube extends below the left hemidiaphragm with the heart size is enlarged. There is vascular congestion and diffuse bilateral hazy interstitial prominence. There is no pneumothorax. There are probable bilateral pleural effusions. IMPRESSION: 1. Lines and tubes as above. Consider further advancing the endotracheal tube  by approximately 1-2 cm. 2. Otherwise, no significant interval change in appearance of both lung fields. Electronically Signed   By: Katherine Mantle M.D.   On: 12/15/2020 15:52    Assessment/Plan Active Problems:   Acute on chronic respiratory failure with hypoxia (HCC)   Chronic atrial fibrillation (HCC)   COVID-19 virus infection   Pneumonia due to COVID-19 virus   Community acquired pneumonia of both lower lobes   1. Acute on chronic respiratory failure hypoxia plan is going to be to continue with full support on the ventilator.  The patient is not doing well prognosis is quite poor 2. Chronic atrial fibrillation rate is controlled we will continue to monitor. 3. COVID-19 virus infection at baseline 4. Pneumonia due to COVID-19 treated we will continue to monitor patient's x-ray showing no significant change or improvement. 5. Pneumothorax resolved after chest tube placement.  We will continue to follow radiologically.  Overall patient has an extremely poor prognosis low chance of survival   I have personally seen and evaluated the patient, evaluated laboratory and imaging results, formulated the assessment and plan and placed orders. The Patient requires high complexity decision making with multiple systems involvement.  Rounds were done with the Respiratory Therapy Director and Staff therapists and discussed with nursing staff also.  Yevonne Pax, MD [facility] Pulmonary Critical Care Medicine Sleep Medicine

## 2020-12-17 NOTE — Consult Note (Signed)
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  His baseline creatinine appears to be between 1.3-1.5 and when checked yesterday had risen to 3.4 and today is up to 4.9 with significant azotemia in the setting of shock and worsening hypoxic respiratory failure.  Labs are also significant for hyperkalemia with mild anion gap metabolic acidosis and hypernatremia.  He was [facility] for additional management.  Past Medical History:  Diagnosis Date  . Acute on chronic respiratory failure with hypoxia (HCC)   . Chronic atrial fibrillation (HCC)   . Community acquired pneumonia of both lower lobes   . COVID-19 virus infection   . Pneumonia due to COVID-19 virus     No family history on file.  Social History:  has no history on file for tobacco use, alcohol use, and drug use.  Allergies: Not on File  Medications:  Scheduled: . docusate  100 mg Per Tube BID  . fentaNYL (SUBLIMAZE) injection  25 mcg Intravenous Once  . hydrocortisone sod succinate (SOLU-CORTEF) inj  50 mg Intravenous Q6H  . insulin aspart  0-15 Units Subcutaneous Q4H  . pantoprazole (PROTONIX) IV  40 mg Intravenous QHS  . polyethylene glycol  17 g Per Tube Daily  . vancomycin variable dose per unstable renal function (pharmacist dosing)   Does not apply See admin instructions    Results for orders placed or performed during the hospital encounter of Jan 05, 2021 (from the past 48 hour(s))  Glucose, capillary      Status: Abnormal   Collection Time: 05-Jan-2021  1:24 PM  Result Value Ref Range   Glucose-Capillary 225 (H) 70 - 99 mg/dL    Comment: Glucose reference range applies only to samples taken after fasting for at least 8 hours.   BMP Latest Ref Rng & Units 01/05/2021 12/16/2020 12/16/2020  Glucose 70 - 99 mg/dL 836(O) 294(T) -  BUN 8 - 23 mg/dL 654(Y) 503(T) -  Creatinine 0.61 - 1.24 mg/dL 4.65(K) 8.12(X) -  Sodium 135 - 145 mmol/L 147(H) 147(H) 154(H)  Potassium 3.5 - 5.1 mmol/L 5.6(H) 6.1(H) 4.9  Chloride 98 - 111 mmol/L 103 104 -  CO2 22 - 32 mmol/L 21(L) 19(L) -  Calcium 8.9 - 10.3 mg/dL 5.3(LL) 6.9(L) -   CBC Latest Ref Rng & Units 01/05/21 12/16/2020 12/16/2020  WBC 4.0 - 10.5 K/uL 30.9(H) - 19.2(H)  Hemoglobin 13.0 - 17.0 g/dL 51.7 00.1 18.4(H)  Hematocrit 39.0 - 52.0 % 44.1 47.0 62.2(H)  Platelets 150 - 400 K/uL PLATELET CLUMPS NOTED ON SMEAR, COUNT APPEARS DECREASED - 181     DG Abd 1 View  Result Date: 12/16/2020 CLINICAL DATA:  Orogastric tube placement. EXAM: ABDOMEN - 1 VIEW COMPARISON:  December 09, 2020. FINDINGS: The bowel gas pattern is normal. Distal tip of nasogastric tube appears to be in the distal stomach. No radio-opaque calculi or other significant radiographic abnormality are seen. IMPRESSION: Distal tip of nasogastric tube appears to be in the distal stomach. Electronically Signed   By: Lupita Raider M.D.   On: 12/16/2020 13:24   US RENAL  Result Date: 2021-01-05 CLINICAL DATA:  Acute renal  injury EXAM: RENAL / URINARY TRACT ULTRASOUND COMPLETE COMPARISON:  None. FINDINGS: Right Kidney: Renal measurements: 12.7 x 6.0 x 6.3 cm. = volume: 250 mL. Echogenicity within normal limits. No mass or hydronephrosis visualized. Left Kidney: Renal measurements: 13.9 x 5.6 x 5.8 cm. = volume: 234 mL. Echogenicity within normal limits. No mass or hydronephrosis visualized. Bladder: Decompressed by Foley catheter Other: None. IMPRESSION: Unremarkable kidneys. Electronically Signed   By: Alcide Clever M.D.   On: 12/21/2020 01:30   DG CHEST PORT 1 VIEW  Result Date: 12/18/2020 CLINICAL DATA:  Central line placement. EXAM: PORTABLE CHEST 1 VIEW COMPARISON:  Earlier same day FINDINGS: 1259 hours. Endotracheal tube tip is approximately 4.8 cm above the base of the carina. The NG tube passes into the stomach although the distal tip position is not included on the film. Right IJ central line is new in the interval with tip overlying the distal SVC level. Right pleural drain again noted. Diffuse bilateral airspace disease is similar to prior. Telemetry leads overlie the chest. IMPRESSION: 1. New right IJ central line tip overlies the distal SVC level near the junction with the RA. No pneumothorax or pleural effusion. 2. Otherwise no substantial interval change in exam. Diffuse bilateral airspace disease. Electronically Signed   By: Kennith Center M.D.   On: 01/05/2021 13:08   DG Chest Port 1 View  Result Date: 12/28/2020 CLINICAL DATA:  Hypoxemia EXAM: PORTABLE CHEST 1 VIEW COMPARISON:  Yesterday FINDINGS: Endotracheal tube with tip just below the clavicular heads. An enteric tube reaches the stomach. Right-sided chest tube in place. Artifact from EKG leads. Cardiomegaly and confluent airspace disease. No visible effusion or pneumothorax. IMPRESSION: 1. Unremarkable hardware. 2. Unchanged extensive airspace disease. Electronically Signed   By: Marnee Spring M.D.   On: 12/28/2020 06:10   DG Chest Portable 1 View  Result Date: 12/16/2020 CLINICAL DATA:  Hypoxia. EXAM: PORTABLE CHEST 1 VIEW COMPARISON:  Chest x-ray from same day at 0809 hours. FINDINGS: Endotracheal tube tip 6.1 cm above the carina. Interval retraction of the feeding tube with the tip now in the upper esophagus. Unchanged right-sided chest tube. No residual pneumothorax. Extensive bilateral interstitial and hazy airspace opacities are unchanged. No large pleural effusion. Stable cardiomediastinal silhouette. IMPRESSION: 1. Interval  retraction of the feeding tube with the tip now in the upper esophagus. Recommend repositioning. 2. Unchanged multifocal pneumonia. Electronically Signed   By: Obie Dredge M.D.   On: 12/16/2020 11:14   DG Chest Portable 1 View  Result Date: 12/16/2020 CLINICAL DATA:  RIGHT pneumothorax post thoracostomy tube insertion, hypoxia, COVID-19 EXAM: PORTABLE CHEST 1 VIEW COMPARISON:  Portable exam 0809 hours compared to 0557 hours FINDINGS: Tip of endotracheal tube projects 6.9 cm above carina. Feeding tube extends into abdomen. New RIGHT pigtail thoracostomy tube. Near-complete of previously identified RIGHT pneumothorax, tiny residual at lung base. Diffuse BILATERAL pulmonary infiltrates consistent with multifocal pneumonia and COVID-19. IMPRESSION: Near complete resolution of RIGHT pneumothorax following chest tube insertion. Persistent diffuse BILATERAL pulmonary infiltrates consistent with multifocal pneumonia and COVID-19. Electronically Signed   By: Ulyses Southward M.D.   On: 12/16/2020 08:21   DG CHEST PORT 1 VIEW  Result Date: 12/16/2020 CLINICAL DATA:  Intubation.  Hypoxia.  COVID. EXAM: PORTABLE CHEST 1 VIEW COMPARISON:  Chest x-ray 12/15/2020. FINDINGS: Endotracheal tube noted stable position. Feeding tube noted with tip in the upper most portion of the stomach. Heart size stable. New moderate sized right pneumothorax noted. Diffuse bilateral pulmonary infiltrates again noted. No  prominent pleural effusion. Costophrenic angles incompletely imaged. Small metallic fragments again noted over the right chest. IMPRESSION: 1. Endotracheal tube in stable position. Feeding tube noted with tip in the upper most portion of the stomach. 2. New moderate-sized right pneumothorax. 3. Diffuse bilateral pulmonary infiltrates again noted. Critical Value/emergent results were called by telephone at the time of interpretation on 12/16/2020 at 6:09 am to nurse Morrie Sheldon, who verbally acknowledged these results. Electronically  Signed   By: Maisie Fus  Register   On: 12/16/2020 06:10   DG Chest Port 1 View  Result Date: 12/15/2020 CLINICAL DATA:  Intubation. EXAM: PORTABLE CHEST 1 VIEW COMPARISON:  12/15/2020 FINDINGS: The endotracheal tube terminates at the thoracic inlet, approximately 8 cm above the carina. The enteric tube extends below the left hemidiaphragm with the heart size is enlarged. There is vascular congestion and diffuse bilateral hazy interstitial prominence. There is no pneumothorax. There are probable bilateral pleural effusions. IMPRESSION: 1. Lines and tubes as above. Consider further advancing the endotracheal tube by approximately 1-2 cm. 2. Otherwise, no significant interval change in appearance of both lung fields. Electronically Signed   By: Katherine Mantle M.D.   On: 12/15/2020 15:52    Review of Systems  Unable to perform ROS: Intubated   There were no vitals taken for this visit. Physical Exam Vitals and nursing note reviewed.  Constitutional:      Appearance: He is obese. He is ill-appearing.     Comments: With blood around ET tube, disheveled  HENT:     Head: Normocephalic and atraumatic.     Nose: Nose normal.     Comments: With blood around mouth and nose    Mouth/Throat:     Pharynx: Posterior oropharyngeal erythema present.  Eyes:     General: No scleral icterus.    Conjunctiva/sclera: Conjunctivae normal.  Cardiovascular:     Rate and Rhythm: Regular rhythm. Tachycardia present.     Pulses: Normal pulses.     Heart sounds: Normal heart sounds.  Pulmonary:     Effort: Pulmonary effort is normal.     Breath sounds: Rales present.     Comments: Right-sided chest tube in place Abdominal:     General: Bowel sounds are normal.     Palpations: Abdomen is soft.     Tenderness: There is no abdominal tenderness. There is no guarding.     Comments: Obese abdomen  Musculoskeletal:     Cervical back: Normal range of motion and neck supple.     Right lower leg: Edema present.      Left lower leg: Edema present.     Comments: 1+ bilateral lower extremity edema  Skin:    General: Skin is warm and dry.     Coloration: Skin is not pale.     Assessment/Plan: 1.  Acute kidney injury on chronic kidney disease stage III: Likely ATN in the setting of sepsis/septic shock.  Begin renal replacement therapy for anuric acute kidney injury for the management of multiple metabolic abnormalities and volume status.  Renal ultrasound done earlier does not show any signs of obstruction and urine studies pending.Avoid nephrotoxic medications including NSAIDs and iodinated intravenous contrast exposure unless the latter is absolutely indicated.  Preferred narcotic agents for pain control are hydromorphone, fentanyl, and methadone. Morphine should not be used. Avoid Baclofen and avoid oral sodium phosphate and magnesium citrate based laxatives / bowel preps. Continue strict Input and Output monitoring. Will monitor the patient closely with you and intervene or adjust therapy as  indicated by changes in clinical status/labs. 2.  Hyperkalemia: Secondary to acute kidney injury, monitor with hemodialysis. 3.  Anion gap metabolic acidosis: Secondary to acute kidney injury, monitor with hemodialysis. 4.  Acute respiratory failure with hypoxia/hypercarbia: With right pneumothorax status post chest tube placement yesterday on management per CCM service. 5.  Shock: Suspected to be septic shock with cultures obtained and initiation of broad-spectrum antimicrobial therapy with vancomycin and Zosyn.   Niquan Charnley K. 12/27/2020, 1:36 PM

## 2020-12-17 NOTE — Progress Notes (Signed)
Pharmacy Antibiotic Note  Matthew Oliver is a 70 y.o. male admitted on 01/06/2021 with sepsis.  Pharmacy has been consulted for Vanc and Zosyn dosing. Patient with AKI and serum creatinine of 4.9, currently anuric. Nephrology plans to start CRRT. Will change pip/tazo to 3.375g q6hr and vancomycin 1500mg  (~12.5 mg/kg) once daily per CRRT dosing recommendations.  Plan: Zosyn 3.375g q6hr Vanc 2,500 mg IV x 1 then 1500mg  once daily No data recorded.  Recent Labs  Lab 12/13/20 1048 12/16/20 0516 12/16/20 1425 12/16/20 2127 01/05/2021 0509 12/31/2020 1259  WBC 16.1* 19.2*  --   --  30.9* 32.1*  CREATININE 1.33* 3.41* 3.99*  --  4.90* 5.45*  LATICACIDVEN  --   --  5.2* 6.0* 4.9* 4.7*    Estimated Creatinine Clearance: 16.6 mL/min (A) (by C-G formula based on SCr of 5.45 mg/dL (H)).    Not on File  Antimicrobials this admission: Vanc 2/4 >>  Zosyn 2/4 >>  Thank you for allowing pharmacy to be a part of this patient's care.  02/14/21, PharmD PGY2 ID Pharmacy Resident  Please check AMION for all [facility] Pharm[facility] phone numbers After 10:00 PM, call Main Pharmacy [phone]  12/28/2020, 4:01 PM

## 2020-12-17 NOTE — Progress Notes (Addendum)
Initial Nutrition Assessment  DOCUMENTATION CODES:   Not applicable  INTERVENTION:   Initiate tube feeding via OG tube: Vital 1.5 at 70 ml/h (1680 ml per day) Prosource TF 90 ml TID  Provides 2760 kcal, 179 gm protein, 1284 ml free water daily  Need to obtain actual weight.   Patient would benefit from a post pyloric Cortrak tube, next Cortrak service on Monday.   NUTRITION DIAGNOSIS:   Increased nutrient needs related to acute illness (renal failure, shock, recent COVID PNA) as evidenced by estimated needs.  GOAL:   Patient will meet greater than or equal to 90% of their needs  MONITOR:   Vent status,TF tolerance,Labs  REASON FOR ASSESSMENT:   Ventilator,Consult Enteral/tube feeding initiation and management  ASSESSMENT:   70 yo male admitted from [facility] with worsening renal failure and shock. PMH includes recent COVID 19 PNA,  Pneumothorax, A fib, HTN, HLD.   R pigtail cath placed in ED 2/3 for pneumothorax, then patient went back to [facility]. Transferred [facility] 2/4 and required intubation. HD cath placed. CRRT to begin today. Received MD Consult for TF initiation and management. Patient has an OG tube. Proning order set in place.  Patient is currently intubated on ventilator support MV: 18.3 L/min Tmax: No data recorded.  Labs reviewed. BUN 160, Creatinine 5.45, Mag 2.8, ionized calcium 0.68 CBG: 225  Medications reviewed and include Colace, Solucortef, Novolog, Protonix, Miralax, Levophed, vasopressin, sodium bicarb.  No weight history available for review. Suspect current weight is an estimate.   NUTRITION - FOCUSED PHYSICAL EXAM:  unable to complete  Diet Order:   Diet Order            Diet NPO time specified  Diet effective now                 EDUCATION NEEDS:   Not appropriate for education at this time  Skin:  Skin Assessment: Reviewed RN Assessment  Last BM:  PTA  Height:   Ht Readings from Last 1  Encounters:  12/16/20 5\' 10"  (1.778 m)    Weight:   Wt Readings from Last 1 Encounters:  12/16/20 120 kg    Ideal Body Weight:  75.5 kg  Estimated Nutritional Needs:   Kcal:  2500-2800  Protein:  165-185 gm  Fluid:  2.5-2.8 L      02/13/21, RD, LDN, CNSC Please refer to Amion for contact information.

## 2020-12-17 NOTE — Progress Notes (Signed)
Nitric oxide initiated at 20ppm per MD order.  Patient tolerating well.

## 2020-12-17 NOTE — Consult Note (Signed)
[facility] CONSULT NOTE    Date: 12/23/2020                  Patient Name:  Matthew Oliver  MRN: [id number]  DOB: 1951/02/07  Age / Sex: 70 y.o., male         PCP: Patient, No Pcp Per                 Service Requesting Consult:  Hospitalist                 Reason for Consult:  Anuric acute kidney injury            History of Present Illness: Patient is a 71 y.o. male with a PMHx of acute respiratory failure secondary to COVID-19 pneumonia, atrial fibrillation, hemoptysis, hypertension, hyperlipidemia, congestive heart failure with preserved EF, right-sided pneumothorax status post chest tube placement who was admitted to [facility] on 12/06/2020 for ongoing management of acute respiratory failure.  Currently patient unable to provide any history.  We are asked to see him for anuric acute kidney injury.  At the moment his BUN is 159 with a creatinine of 4.90.  He also has hyperkalemia with a serum potassium of 5.6.  Serum sodium also bit high at 147.  He is currently on maximum dosage Levophed at 29 mcg.  He is also requiring significant ventilatory support with 100% FiO2.  In addition patient noted be tachycardic into the 120s.  The primary team had a discussion with the patient's family who requested that all aggressive measures to be undertaken.  The primary team discussed the potential for renal placement therapy with the patient's family and they have requested that this be performed.   Medications:  Current medications: Levophed drip at 21 mcg, meropenem 1 g IV daily, Humalog sliding scale insulin, aspirin 81 mg daily, atorvastatin 10 mg daily, fish oil 4 g daily, folic acid 2 mg daily, methylprednisolone 60 mg 3 times daily, metoprolol 25 mg daily, metronidazole 500 mg 3 times daily, prostat 30 cc 3 times daily, Zoloft 25 mg daily, multivitamin 1 tablet daily, Ambien 10 mg nightly   Allergies: No known drug allergies   Past Medical History: Past  Medical History:  Diagnosis Date  . Acute on chronic respiratory failure with hypoxia (HCC)   . Chronic atrial fibrillation (HCC)   . Community acquired pneumonia of both lower lobes   . COVID-19 virus infection   . Pneumonia due to COVID-19 virus   Right-sided pneumothorax   Past Surgical History: Chest tube placement  Family History: Unable to obtain family history as the patient is currently on the ventilator.  Social History: Unable to obtain family history as the patient is currently on the ventilator.  Review of Systems: Unable to obtain family history as the patient is currently on the ventilator.  Vital Signs: Temperature 96.2 pulse 120 respirations 34 blood pressure 163/89  Weight trends: There were no vitals filed for this visit.  Physical Exam: Physical Exam: General:  Critically ill-appearing  Head:  Bloody gauze noted in both nostrils, endotracheal tube and OG tube in place  Eyes:  Closed  Neck:  Supple  Lungs:   Bilateral rales and rhonchi, vent assisted  Heart:  S1S2 irregular, tachycardic  Abdomen:   Soft, nontender, mild distention  Extremities:  1+ peripheral edema.  Neurologic:  Intubated, not following commands  Skin:  No acute skin rash  Access:  No hemodialysis access    Lab results:  Basic Metabolic Panel: Recent Labs  Lab 12/16/20 0516 12/16/20 0922 12/16/20 1425 01-07-21 0509  NA 154* 154* 147* 147*  K 5.6* 4.9 6.1* 5.6*  CL 107  --  104 103  CO2 25  --  19* 21*  GLUCOSE 153*  --  356* 295*  BUN 137*  --  136* 159*  CREATININE 3.41*  --  3.99* 4.90*  CALCIUM 8.3*  --  6.9* 5.3*    Liver Function Tests: No results for input(s): AST, ALT, ALKPHOS, BILITOT, PROT, ALBUMIN in the last 168 hours. No results for input(s): LIPASE, AMYLASE in the last 168 hours. No results for input(s): AMMONIA in the last 168 hours.  CBC: Recent Labs  Lab 12/13/20 1048 12/16/20 0516 12/16/20 0922 01/07/2021 0509  WBC 16.1* 19.2*  --  30.9*  HGB  16.3 18.4* 16.0 13.9  HCT 50.3 62.2* 47.0 44.1  MCV 96.9 107.4*  --  104.8*  PLT 248 181  --  PLATELET CLUMPS NOTED ON SMEAR, COUNT APPEARS DECREASED    Cardiac Enzymes: No results for input(s): CKTOTAL, CKMB, CKMBINDEX, TROPONINI in the last 168 hours.  BNP: Invalid input(s): POCBNP  CBG: No results for input(s): GLUCAP in the last 168 hours.  Microbiology: Results for orders placed or performed during the hospital encounter of 12/06/20  Culture, blood (routine x 2)     Status: None   Collection Time: 12/11/20  2:45 PM   Specimen: BLOOD LEFT HAND  Result Value Ref Range Status   Specimen Description BLOOD LEFT HAND  Final   Special Requests   Final    BOTTLES DRAWN AEROBIC ONLY Blood Culture adequate volume   Culture   Final    NO GROWTH 5 DAYS Performed at [facility], [address], [place], [place] [address]    Report Status 12/16/2020 FINAL  Final  Culture, blood (routine x 2)     Status: None   Collection Time: 12/11/20  2:59 PM   Specimen: BLOOD  Result Value Ref Range Status   Specimen Description BLOOD LEFT ANTECUBITAL  Final   Special Requests   Final    BOTTLES DRAWN AEROBIC ONLY Blood Culture adequate volume   Culture   Final    NO GROWTH 5 DAYS Performed at [facility], [address] [place], [place] [address]    Report Status 12/16/2020 FINAL  Final    Coagulation Studies: No results for input(s): LABPROT, INR in the last 72 hours.  Urinalysis: No results for input(s): COLORURINE, LABSPEC, PHURINE, GLUCOSEU, HGBUR, BILIRUBINUR, KETONESUR, PROTEINUR, UROBILINOGEN, NITRITE, LEUKOCYTESUR in the last 72 hours.  Invalid input(s): APPERANCEUR    Imaging: DG Abd 1 View  Result Date: 12/16/2020 CLINICAL DATA:  Orogastric tube placement. EXAM: ABDOMEN - 1 VIEW COMPARISON:  December 09, 2020. FINDINGS: The bowel gas pattern is normal. Distal tip of nasogastric tube appears to be in the distal stomach. No radio-opaque calculi or other  significant radiographic abnormality are seen. IMPRESSION: Distal tip of nasogastric tube appears to be in the distal stomach. Electronically Signed   By: Lupita Raider M.D.   On: 12/16/2020 13:24   US RENAL  Result Date: 2021/01/07 CLINICAL DATA:  Acute renal injury EXAM: RENAL / URINARY TRACT ULTRASOUND COMPLETE COMPARISON:  None. FINDINGS: Right Kidney: Renal measurements: 12.7 x 6.0 x 6.3 cm. = volume: 250 mL. Echogenicity within normal limits. No mass or hydronephrosis visualized. Left Kidney: Renal measurements: 13.9 x 5.6 x 5.8 cm. = volume: 234 mL.  Echogenicity within normal limits. No mass or hydronephrosis visualized. Bladder: Decompressed by Foley catheter Other: None. IMPRESSION: Unremarkable kidneys. Electronically Signed   By: Alcide Clever M.D.   On: 01/05/2021 01:30   DG Chest Port 1 View  Result Date: 12/22/2020 CLINICAL DATA:  Hypoxemia EXAM: PORTABLE CHEST 1 VIEW COMPARISON:  Yesterday FINDINGS: Endotracheal tube with tip just below the clavicular heads. An enteric tube reaches the stomach. Right-sided chest tube in place. Artifact from EKG leads. Cardiomegaly and confluent airspace disease. No visible effusion or pneumothorax. IMPRESSION: 1. Unremarkable hardware. 2. Unchanged extensive airspace disease. Electronically Signed   By: Marnee Spring M.D.   On: 12/15/2020 06:10   DG Chest Portable 1 View  Result Date: 12/16/2020 CLINICAL DATA:  Hypoxia. EXAM: PORTABLE CHEST 1 VIEW COMPARISON:  Chest x-ray from same day at 0809 hours. FINDINGS: Endotracheal tube tip 6.1 cm above the carina. Interval retraction of the feeding tube with the tip now in the upper esophagus. Unchanged right-sided chest tube. No residual pneumothorax. Extensive bilateral interstitial and hazy airspace opacities are unchanged. No large pleural effusion. Stable cardiomediastinal silhouette. IMPRESSION: 1. Interval retraction of the feeding tube with the tip now in the upper esophagus. Recommend repositioning. 2.  Unchanged multifocal pneumonia. Electronically Signed   By: Obie Dredge M.D.   On: 12/16/2020 11:14   DG Chest Portable 1 View  Result Date: 12/16/2020 CLINICAL DATA:  RIGHT pneumothorax post thoracostomy tube insertion, hypoxia, COVID-19 EXAM: PORTABLE CHEST 1 VIEW COMPARISON:  Portable exam 0809 hours compared to 0557 hours FINDINGS: Tip of endotracheal tube projects 6.9 cm above carina. Feeding tube extends into abdomen. New RIGHT pigtail thoracostomy tube. Near-complete of previously identified RIGHT pneumothorax, tiny residual at lung base. Diffuse BILATERAL pulmonary infiltrates consistent with multifocal pneumonia and COVID-19. IMPRESSION: Near complete resolution of RIGHT pneumothorax following chest tube insertion. Persistent diffuse BILATERAL pulmonary infiltrates consistent with multifocal pneumonia and COVID-19. Electronically Signed   By: Ulyses Southward M.D.   On: 12/16/2020 08:21   DG CHEST PORT 1 VIEW  Result Date: 12/16/2020 CLINICAL DATA:  Intubation.  Hypoxia.  COVID. EXAM: PORTABLE CHEST 1 VIEW COMPARISON:  Chest x-ray 12/15/2020. FINDINGS: Endotracheal tube noted stable position. Feeding tube noted with tip in the upper most portion of the stomach. Heart size stable. New moderate sized right pneumothorax noted. Diffuse bilateral pulmonary infiltrates again noted. No prominent pleural effusion. Costophrenic angles incompletely imaged. Small metallic fragments again noted over the right chest. IMPRESSION: 1. Endotracheal tube in stable position. Feeding tube noted with tip in the upper most portion of the stomach. 2. New moderate-sized right pneumothorax. 3. Diffuse bilateral pulmonary infiltrates again noted. Critical Value/emergent results were called by telephone at the time of interpretation on 12/16/2020 at 6:09 am to nurse Morrie Sheldon, who verbally acknowledged these results. Electronically Signed   By: Maisie Fus  Register   On: 12/16/2020 06:10   DG Chest Port 1 View  Result Date:  12/15/2020 CLINICAL DATA:  Intubation. EXAM: PORTABLE CHEST 1 VIEW COMPARISON:  12/15/2020 FINDINGS: The endotracheal tube terminates at the thoracic inlet, approximately 8 cm above the carina. The enteric tube extends below the left hemidiaphragm with the heart size is enlarged. There is vascular congestion and diffuse bilateral hazy interstitial prominence. There is no pneumothorax. There are probable bilateral pleural effusions. IMPRESSION: 1. Lines and tubes as above. Consider further advancing the endotracheal tube by approximately 1-2 cm. 2. Otherwise, no significant interval change in appearance of both lung fields. Electronically Signed   By: Cristal Deer  Green M.D.   On: 12/15/2020 15:52      Assessment & Plan: Pt is a 70 y.o. male with a PMHx of acute respiratory failure secondary to COVID-19 pneumonia, atrial fibrillation, hemoptysis, hypertension, hyperlipidemia, congestive heart failure with preserved EF, right-sided pneumothorax status post chest tube placement who was admitted to [facility] on 12/06/20 for ongoing management of acute respiratory failure.   1.  Acute kidney injury suspect secondary to acute tubular necrosis.  Anuric at the moment. 2.  Acute respiratory failure with recent COVID-19 pneumonia. 3.  Atrial fibrillation with rapid ventricular response. 4.  Hypotension on Levophed 29 mcg currently. 5.  Hyperkalemia serum potassium 5.6. 6.  Hypernatremia serum sodium 147. 7.  Right-sided pneumothorax status post chest tube placement.  Plan: We were asked to see the patient for evaluation management of anuric acute kidney injury in light of hyperkalemia and significant hypotension currently on Levophed 29 mcg.  Patient also noted to be significantly tachycardic into the 120s.  Also requiring 100% FiO2 on the ventilator.  The primary team had a long discussion with the patient's family.  At the moment it appears that they are requesting aggressive care in this situation  which would include renal placement therapy.  Patient too unstable to undergo conventional intermittent hemodialysis at the moment.  Therefore would recommend consideration of continuous renal placement therapy should the family continue to desire aggressive care.  Unfortunately we do not have continuous renal placement therapy available at [facility].  Therefore the patient would need to be transferred to [facility] for consideration of CRRT.  This was discussed in depth with Dr. Manson Passey.  She will have further discussions with the family.  Overall prognosis very guarded.

## 2020-12-17 NOTE — Progress Notes (Signed)
Pharmacy Antibiotic Note  Matthew Oliver is a 69 y.o. male admitted on 12-24-20 with sepsis.  Pharmacy has been consulted for Vanc and Zosyn dosing. Patient with AKI and serum creatinine of 4.9, currently anuric.  Plan: Zosyn 2.25 gm IV q6hr Vanc 2,500 mg IV x 1, then vanc variable dosing due to changing renal function and possible dialysis Monitor renal function and dialysis, clinical status and C&S.   No data recorded.  Recent Labs  Lab 12/12/20 0435 12/13/20 1048 12/16/20 0516 12/16/20 1425 12/16/20 2127 12-24-2020 0509  WBC  --  16.1* 19.2*  --   --  30.9*  CREATININE 1.29* 1.33* 3.41* 3.99*  --  4.90*  LATICACIDVEN  --   --   --  5.2* 6.0* 4.9*    Estimated Creatinine Clearance: 18.5 mL/min (A) (by C-G formula based on SCr of 4.9 mg/dL (H)).    Not on File  Antimicrobials this admission: Vanc 2/4 >>  Zosyn 2/4 >>  Thank you for allowing pharmacy to be a part of this patient's care.  Jeanella Cara, PharmD, [facility] Clinical Pharmacist Please see AMION for all Pharmacists' Contact Phone Numbers Dec 24, 2020, 12:55 PM

## 2020-12-17 NOTE — Progress Notes (Signed)
eLink Physician-Brief Progress Note Patient Name: Matthew Oliver DOB: September 03, 1951 MRN: [id number]   Date of Service  01/12/21  HPI/Events of Note  ABG reviewed, mildly improved oxygenation with stable  Ventilation, ionized calcium low at 0.74.                                                                                                                                                                                                  eICU Interventions  Replete calcium per electrolyte replacement protocol        Dub Maclellan U Goldia Ligman 01-12-2021, 10:14 PM

## 2020-12-18 ENCOUNTER — Encounter (HOSPITAL_COMMUNITY): Payer: Self-pay | Admitting: Internal Medicine

## 2020-12-18 ENCOUNTER — Inpatient Hospital Stay (HOSPITAL_COMMUNITY): Payer: Medicare Other

## 2020-12-18 ENCOUNTER — Other Ambulatory Visit: Payer: Self-pay

## 2020-12-18 DIAGNOSIS — U071 COVID-19: Secondary | ICD-10-CM

## 2020-12-18 DIAGNOSIS — N170 Acute kidney failure with tubular necrosis: Secondary | ICD-10-CM | POA: Diagnosis not present

## 2020-12-18 DIAGNOSIS — Z1621 Resistance to vancomycin: Secondary | ICD-10-CM

## 2020-12-18 DIAGNOSIS — J690 Pneumonitis due to inhalation of food and vomit: Secondary | ICD-10-CM

## 2020-12-18 DIAGNOSIS — R609 Edema, unspecified: Secondary | ICD-10-CM

## 2020-12-18 DIAGNOSIS — A419 Sepsis, unspecified organism: Secondary | ICD-10-CM

## 2020-12-18 DIAGNOSIS — K7200 Acute and subacute hepatic failure without coma: Secondary | ICD-10-CM

## 2020-12-18 DIAGNOSIS — E872 Acidosis: Secondary | ICD-10-CM

## 2020-12-18 DIAGNOSIS — R6521 Severe sepsis with septic shock: Secondary | ICD-10-CM

## 2020-12-18 DIAGNOSIS — A4189 Other specified sepsis: Secondary | ICD-10-CM | POA: Diagnosis not present

## 2020-12-18 DIAGNOSIS — J9621 Acute and chronic respiratory failure with hypoxia: Secondary | ICD-10-CM | POA: Diagnosis not present

## 2020-12-18 DIAGNOSIS — N179 Acute kidney failure, unspecified: Secondary | ICD-10-CM

## 2020-12-18 DIAGNOSIS — B952 Enterococcus as the cause of diseases classified elsewhere: Secondary | ICD-10-CM

## 2020-12-18 LAB — POCT I-STAT 7, (LYTES, BLD GAS, ICA,H+H)
Acid-base deficit: 5 mmol/L — ABNORMAL HIGH (ref 0.0–2.0)
Bicarbonate: 24.2 mmol/L (ref 20.0–28.0)
Calcium, Ion: 0.93 mmol/L — ABNORMAL LOW (ref 1.15–1.40)
HCT: 44 % (ref 39.0–52.0)
Hemoglobin: 15 g/dL (ref 13.0–17.0)
O2 Saturation: 79 %
Patient temperature: 96
Potassium: 4.8 mmol/L (ref 3.5–5.1)
Sodium: 138 mmol/L (ref 135–145)
TCO2: 26 mmol/L (ref 22–32)
pCO2 arterial: 56.1 mmHg — ABNORMAL HIGH (ref 32.0–48.0)
pH, Arterial: 7.235 — ABNORMAL LOW (ref 7.350–7.450)
pO2, Arterial: 48 mmHg — ABNORMAL LOW (ref 83.0–108.0)

## 2020-12-18 LAB — CBC
HCT: 44.6 % (ref 39.0–52.0)
Hemoglobin: 14 g/dL (ref 13.0–17.0)
MCH: 32.3 pg (ref 26.0–34.0)
MCHC: 31.4 g/dL (ref 30.0–36.0)
MCV: 103 fL — ABNORMAL HIGH (ref 80.0–100.0)
Platelets: 54 10*3/uL — ABNORMAL LOW (ref 150–400)
RBC: 4.33 MIL/uL (ref 4.22–5.81)
RDW: 14.8 % (ref 11.5–15.5)
WBC: 40.3 10*3/uL — ABNORMAL HIGH (ref 4.0–10.5)
nRBC: 0.2 % (ref 0.0–0.2)

## 2020-12-18 LAB — BLOOD CULTURE ID PANEL (REFLEXED) - BCID2
A.calcoaceticus-baumannii: NOT DETECTED
Bacteroides fragilis: NOT DETECTED
Candida albicans: NOT DETECTED
Candida auris: NOT DETECTED
Candida glabrata: NOT DETECTED
Candida krusei: NOT DETECTED
Candida parapsilosis: NOT DETECTED
Candida tropicalis: NOT DETECTED
Cryptococcus neoformans/gattii: NOT DETECTED
Enterobacter cloacae complex: NOT DETECTED
Enterobacterales: NOT DETECTED
Enterococcus Faecium: DETECTED — AB
Enterococcus faecalis: NOT DETECTED
Escherichia coli: NOT DETECTED
Haemophilus influenzae: NOT DETECTED
Klebsiella aerogenes: NOT DETECTED
Klebsiella oxytoca: NOT DETECTED
Klebsiella pneumoniae: NOT DETECTED
Listeria monocytogenes: NOT DETECTED
Neisseria meningitidis: NOT DETECTED
Proteus species: NOT DETECTED
Pseudomonas aeruginosa: NOT DETECTED
Salmonella species: NOT DETECTED
Serratia marcescens: NOT DETECTED
Staphylococcus aureus (BCID): NOT DETECTED
Staphylococcus epidermidis: NOT DETECTED
Staphylococcus lugdunensis: NOT DETECTED
Staphylococcus species: NOT DETECTED
Stenotrophomonas maltophilia: NOT DETECTED
Streptococcus agalactiae: NOT DETECTED
Streptococcus pneumoniae: NOT DETECTED
Streptococcus pyogenes: NOT DETECTED
Streptococcus species: NOT DETECTED
Vancomycin resistance: DETECTED — AB

## 2020-12-18 LAB — BASIC METABOLIC PANEL
Anion gap: 22 — ABNORMAL HIGH (ref 5–15)
BUN: 65 mg/dL — ABNORMAL HIGH (ref 8–23)
CO2: 17 mmol/L — ABNORMAL LOW (ref 22–32)
Calcium: 6.6 mg/dL — ABNORMAL LOW (ref 8.9–10.3)
Chloride: 99 mmol/L (ref 98–111)
Creatinine, Ser: 3.14 mg/dL — ABNORMAL HIGH (ref 0.61–1.24)
GFR, Estimated: 21 mL/min — ABNORMAL LOW (ref >60)
Glucose, Bld: 129 mg/dL — ABNORMAL HIGH (ref 70–99)
Potassium: 4.5 mmol/L (ref 3.5–5.1)
Sodium: 138 mmol/L (ref 135–145)

## 2020-12-18 LAB — RENAL FUNCTION PANEL
Albumin: 1.8 g/dL — ABNORMAL LOW (ref 3.5–5.0)
Albumin: 1.8 g/dL — ABNORMAL LOW (ref 3.5–5.0)
Anion gap: 20 — ABNORMAL HIGH (ref 5–15)
Anion gap: 19 — ABNORMAL HIGH (ref 5–15)
BUN: 113 mg/dL — ABNORMAL HIGH (ref 8–23)
BUN: 76 mg/dL — ABNORMAL HIGH (ref 8–23)
CO2: 21 mmol/L — ABNORMAL LOW (ref 22–32)
CO2: 19 mmol/L — ABNORMAL LOW (ref 22–32)
Calcium: 6.3 mg/dL — CL (ref 8.9–10.3)
Calcium: 6.5 mg/dL — ABNORMAL LOW (ref 8.9–10.3)
Chloride: 98 mmol/L (ref 98–111)
Chloride: 98 mmol/L (ref 98–111)
Creatinine, Ser: 3.92 mg/dL — ABNORMAL HIGH (ref 0.61–1.24)
Creatinine, Ser: 3.26 mg/dL — ABNORMAL HIGH (ref 0.61–1.24)
GFR, Estimated: 16 mL/min — ABNORMAL LOW (ref >60)
GFR, Estimated: 20 mL/min — ABNORMAL LOW (ref >60)
Glucose, Bld: 148 mg/dL — ABNORMAL HIGH (ref 70–99)
Glucose, Bld: 104 mg/dL — ABNORMAL HIGH (ref 70–99)
Phosphorus: 8.6 mg/dL — ABNORMAL HIGH (ref 2.5–4.6)
Phosphorus: 8.2 mg/dL — ABNORMAL HIGH (ref 2.5–4.6)
Potassium: 4.7 mmol/L (ref 3.5–5.1)
Potassium: 5.5 mmol/L — ABNORMAL HIGH (ref 3.5–5.1)
Sodium: 139 mmol/L (ref 135–145)
Sodium: 136 mmol/L (ref 135–145)

## 2020-12-18 LAB — MAGNESIUM
Magnesium: 2.7 mg/dL — ABNORMAL HIGH (ref 1.7–2.4)
Magnesium: 2.6 mg/dL — ABNORMAL HIGH (ref 1.7–2.4)

## 2020-12-18 LAB — HEPATIC FUNCTION PANEL
ALT: 2330 U/L — ABNORMAL HIGH (ref 0–44)
AST: 2818 U/L — ABNORMAL HIGH (ref 15–41)
Albumin: 1.8 g/dL — ABNORMAL LOW (ref 3.5–5.0)
Alkaline Phosphatase: 111 U/L (ref 38–126)
Bilirubin, Direct: 2.2 mg/dL — ABNORMAL HIGH (ref 0.0–0.2)
Indirect Bilirubin: 1.7 mg/dL — ABNORMAL HIGH (ref 0.3–0.9)
Total Bilirubin: 3.9 mg/dL — ABNORMAL HIGH (ref 0.3–1.2)
Total Protein: 5.5 g/dL — ABNORMAL LOW (ref 6.5–8.1)

## 2020-12-18 LAB — LACTIC ACID, PLASMA: Lactic Acid, Venous: 7.6 mmol/L (ref 0.5–1.9)

## 2020-12-18 LAB — GLUCOSE, CAPILLARY
Glucose-Capillary: 154 mg/dL — ABNORMAL HIGH (ref 70–99)
Glucose-Capillary: 118 mg/dL — ABNORMAL HIGH (ref 70–99)
Glucose-Capillary: 80 mg/dL (ref 70–99)
Glucose-Capillary: 100 mg/dL — ABNORMAL HIGH (ref 70–99)
Glucose-Capillary: 64 mg/dL — ABNORMAL LOW (ref 70–99)
Glucose-Capillary: 183 mg/dL — ABNORMAL HIGH (ref 70–99)
Glucose-Capillary: 144 mg/dL — ABNORMAL HIGH (ref 70–99)

## 2020-12-18 LAB — TRIGLYCERIDES: Triglycerides: 219 mg/dL — ABNORMAL HIGH (ref <150)

## 2020-12-18 MED ORDER — SODIUM BICARBONATE 8.4 % IV SOLN
50.0000 meq | Freq: Once | INTRAVENOUS | Status: AC
  Administered 2020-12-18: 50 meq via INTRAVENOUS
  Filled 2020-12-18: qty 50

## 2020-12-18 MED ORDER — PHENYLEPHRINE CONCENTRATED 100MG/250ML (0.4 MG/ML) INFUSION SIMPLE
0.0000 ug/min | INTRAVENOUS | Status: DC
  Administered 2020-12-18: 300 ug/min via INTRAVENOUS
  Administered 2020-12-18: 50 ug/min via INTRAVENOUS
  Administered 2020-12-19 (×2): 400 ug/min via INTRAVENOUS
  Administered 2020-12-19: 380 ug/min via INTRAVENOUS
  Filled 2020-12-18 (×6): qty 250

## 2020-12-18 MED ORDER — CALCIUM GLUCONATE-NACL 2-0.675 GM/100ML-% IV SOLN
2.0000 g | Freq: Once | INTRAVENOUS | Status: AC
  Administered 2020-12-18: 2000 mg via INTRAVENOUS
  Filled 2020-12-18: qty 100

## 2020-12-18 MED ORDER — ORAL CARE MOUTH RINSE
15.0000 mL | OROMUCOSAL | Status: DC
  Administered 2020-12-18 – 2020-12-19 (×15): 15 mL via OROMUCOSAL

## 2020-12-18 MED ORDER — CALCIUM CARBONATE ANTACID 1250 MG/5ML PO SUSP
500.0000 mg | Freq: Three times a day (TID) | ORAL | Status: DC
  Administered 2020-12-18 – 2020-12-19 (×3): 500 mg
  Filled 2020-12-18 (×3): qty 5

## 2020-12-18 MED ORDER — DEXTROSE 50 % IV SOLN
INTRAVENOUS | Status: AC
  Administered 2020-12-18: 50 mL
  Filled 2020-12-18: qty 50

## 2020-12-18 MED ORDER — LINEZOLID 600 MG/300ML IV SOLN
600.0000 mg | Freq: Two times a day (BID) | INTRAVENOUS | Status: DC
  Administered 2020-12-18 – 2020-12-19 (×3): 600 mg via INTRAVENOUS
  Filled 2020-12-18 (×4): qty 300

## 2020-12-18 MED ORDER — PRISMASOL BGK 0/2.5 32-2.5 MEQ/L EC SOLN
Status: DC
  Filled 2020-12-18 (×12): qty 5000

## 2020-12-18 MED ORDER — PHENYLEPHRINE HCL-NACL 10-0.9 MG/250ML-% IV SOLN: 0.0000 ug/min | INTRAVENOUS | Status: DC

## 2020-12-18 MED ORDER — CHLORHEXIDINE GLUCONATE 0.12% ORAL RINSE (MEDLINE KIT)
15.0000 mL | Freq: Two times a day (BID) | OROMUCOSAL | Status: DC
  Administered 2020-12-18 – 2020-12-19 (×3): 15 mL via OROMUCOSAL

## 2020-12-18 MED ORDER — WHITE PETROLATUM EX OINT
TOPICAL_OINTMENT | CUTANEOUS | Status: AC
  Administered 2020-12-18: 0.2
  Filled 2020-12-18: qty 28.35

## 2020-12-18 MED ORDER — CHLORHEXIDINE GLUCONATE CLOTH 2 % EX PADS
6.0000 | MEDICATED_PAD | Freq: Every day | CUTANEOUS | Status: DC
  Administered 2020-12-18 – 2020-12-19 (×2): 6 via TOPICAL

## 2020-12-18 NOTE — H&P (Deleted)
NAME:  Rocko Fesperman, MRN:  [id number], DOB:  03-30-1951, LOS: 1 ADMISSION DATE:  December 22, 2020, CONSULTATION DATE:  2/4 REFERRING MD:  [facility], CHIEF COMPLAINT:  Renal failure, shock  Brief History:  70 yo M from [facility] following COVID19 PNA who has hypoxemic respiratory failure, ptx (s/p pigtail), renal failure, and shock (likely septic)   History of Present Illness:  70 yo M PMH COVID-19 PNA, HTN, HLD who has been at [facility] following COVID-19 PNA (postive date 1/14), transferred to MICU 2/4 with worsening renal failure, shock, and progressive hypoxemia on MV. Previously pt DNR. Notably, pt to ED 2/3 with ptx and R pigtail was placed at that time. 2/3 [facility] EDP also d/w physician at [facility] concerns  About pt new pressor requirement, but pt taken back to [facility] per [facility] MD.  In review of labs, looks like 2/3 pt had new hyperkalemia and AKI with Cr 3.4 from 1.3.  On 2/4 the patient had worsening renal failure and worsening shock, which prompted transfer from [facility] to ICU.   Past Medical History:  COVID 19 PNA Afib Acute on chronic respiratory failure HTN HLD  Significant Hospital Events:  2/4 transferred to [facility]U from [facility]. Renal failure, worsening shock. Intubated, on high vent settings and SpO2 88% 2/5 worsening shock  Consults:  nephro  Procedures:  2/3 R pigtail (placed in ED) 2/4 CVC R IJ (placed at [facility])  2/4 HD cath   Significant Diagnostic Tests:  2/4 CXR >>  Micro Data:  Prior COVID +  2/4 BCx> 2/4 Tracheal aspirate>  Antimicrobials:  2/4 vanc> 2/4 zosyn>  Interim History / Subjective:  Worsening shock, sats low 80s, high 70s on ABGs.  Objective   Blood pressure 109/85, pulse 99, temperature (!) 94.9 F (34.9 C), temperature source Axillary, resp. rate (!) 30, weight 129.1 kg, SpO2 (!) 89 %.    Vent Mode: PRVC FiO2 (%):  [100 %] 100 % Set Rate:  [30 bmp] 30 bmp Vt Set:  [580 mL] 580 mL PEEP:  [10 cmH20-12 cmH20] 10 cmH20 Plateau  Pressure:  [29 cmH20-36 cmH20] 29 cmH20   Intake/Output Summary (Last 24 hours) at 12/18/2020 1419 Last data filed at 12/18/2020 1300 Gross per 24 hour  Intake 6027.96 ml  Output 7041 ml  Net -1013.04 ml   Filed Weights   2020-12-22 1530 12/18/20 0421  Weight: 128.8 kg 129.1 kg    Examination: General: Critically ill appearing older adult M, intubated sedated NAD HENT: ETT secure. Bilateral nares packed and saturated with blood, improved from prior. Trachea midline  Lungs: R chest tube. Mechanically ventilated. Rhonchi Cardiovascular: rrr sluggish cap refill s1s2 Abdomen: round nd nt Extremities:  Neuro: Sedated. Not following commands.  Resolved Hospital Problem list     Assessment & Plan:   Acute respiratory failure with hypoxemia and hypercarbia requiring MV -pt recently reversed code status R PTX s/p chest tube placement 2/3 Recent COVID-19 PNA P -Full MV support -VAP, PAD -routine chest tube management - no air leak currently  Septic shock: VRE pneumonia and bacteremia.  Lactic acidosis P -zosyn, linezolid -stress dose steroids -Line placed 2/4 in setting of shock and after decompensation, clinically not source of bacteremia, will keep for now, unable to give line holiday given multiple drips keeoing him alive  Epistaxis, with ongoing bleeding -likely r/t trauma from NGT attempts + apixaban for Afib.  -ongoing bleeding likely worsened due to uremia P -rhino rockets  -Labs not consistent with DIC  Acute renal failure with  anuria and uremia  -seen by nephro at [facility] prior to transfer -renal US 2/4 without etiology for renal failure Hyperkalemia AGMA P -CRRT still with acidemia -continue bicarb gtt for now   Hx Afib -ICU monitoring -optimize electrolytes -not on hep gtt -- ongoing epistaxis,  Hypocalcemia Hypernatremia, mild P -replace Ca, cont to trend   Hyperglycemia P -SSI  Best practice (evaluated daily)  Diet: npo Pain/Anxiety/Delirium  protocol (if indicated): fent, PRN versed VAP protocol (if indicated): yes DVT prophylaxis: SCD GI prophylaxis: protonix  Glucose control: SSI Mobility: BR Disposition:ICU  Goals of Care:  Last date of multidisciplinary goals of care discussion: 2/4 family updated upon arrival to MICU. Pt reversed code status from DNR to Full at [facility], and was intubated in this setting. Had apparently also articulated "wanting everything" Family and staff present: Dr Katrinka Blazing PCCM Summary of discussion:  Full scope Follow up goals of care discussion due: 2/11 Code Status: [facility]   CBC: Recent Labs  Lab 12/13/20 1048 12/16/20 0516 12/16/20 0922 12/16/2020 0509 12/28/2020 1259 12/28/2020 1421 01/04/2021 2129 12/18/20 0005 12/18/20 0915  WBC 16.1* 19.2*  --  30.9* 32.1*  --   --  40.3*  --   HGB 16.3 18.4*   < > 13.9 12.9* 12.6* 13.6 14.0 15.0  HCT 50.3 62.2*   < > 44.1 40.0 37.0* 40.0 44.6 44.0  MCV 96.9 107.4*  --  104.8* 101.5*  --   --  103.0*  --   PLT 248 181  --  PLATELET CLUMPS NOTED ON SMEAR, COUNT APPEARS DECREASED 58*  56*  --   --  54*  --    < > = values in this interval not displayed.    Basic Metabolic Panel: Recent Labs  Lab 12/16/20 1425 12/21/2020 0509 12/15/2020 1259 01/10/2021 1421 12/14/2020 1640 01/10/2021 2129 12/18/20 0005 12/18/20 0412 12/18/20 0915  NA 147* 147* 143 141 144 142  --  139 138  K 6.1* 5.6* 4.9 4.5 4.9 4.3  --  4.7 4.8  CL 104 103 100  --  100  --   --  98  --   CO2 19* 21* 22  --  22  --   --  21*  --   GLUCOSE 356* 295* 277*  --  191*  --   --  148*  --   BUN 136* 159* 160*  --  162*  --   --  113*  --   CREATININE 3.99* 4.90* 5.45*  --  5.44*  --   --  3.92*  --   CALCIUM 6.9* 5.3* 5.2*  --  5.4*  --   --  6.3*  --   MG  --   --  2.8*  --   --   --  2.7*  --   --   PHOS  --   --   --   --  11.5*  --   --  8.6*  --    GFR: Estimated Creatinine Clearance: 24 mL/min (A) (by C-G formula based on SCr of 3.92 mg/dL (H)). Recent Labs  Lab 12/16/20 0516  12/16/20 1425 12/16/20 2127 12/28/2020 0509 01/07/2021 1259 12/23/2020 1640 12/18/20 0005  WBC 19.2*  --   --  30.9* 32.1*  --  40.3*  LATICACIDVEN  --    < > 6.0* 4.9* 4.7* 4.1*  --    < > = values in this interval not displayed.    Liver Function Tests: Recent  Labs  Lab December 27, 2020 1259 December 27, 2020 1640 12/18/20 0005 12/18/20 0412  AST 3,276*  --  2,818*  --   ALT 2,332*  --  2,330*  --   ALKPHOS 98  --  111  --   BILITOT 3.1*  --  3.9*  --   PROT 4.9*  --  5.5*  --   ALBUMIN 1.5* 1.5* 1.8* 1.8*   No results for input(s): LIPASE, AMYLASE in the last 168 hours. No results for input(s): AMMONIA in the last 168 hours.  ABG    Component Value Date/Time   PHART 7.235 (L) 12/18/2020 0915   PCO2ART 56.1 (H) 12/18/2020 0915   PO2ART 48 (L) 12/18/2020 0915   HCO3 24.2 12/18/2020 0915   TCO2 26 12/18/2020 0915   ACIDBASEDEF 5.0 (H) 12/18/2020 0915   O2SAT 79.0 12/18/2020 0915     Coagulation Profile: Recent Labs  Lab December 27, 2020 1259  INR 6.3*    Cardiac Enzymes: No results for input(s): CKTOTAL, CKMB, CKMBINDEX, TROPONINI in the last 168 hours.  HbA1C: Hgb A1c MFr Bld  Date/Time Value Ref Range Status  Dec 27, 2020 12:59 PM 6.8 (H) 4.8 - 5.6 % Final    Comment:    (NOTE) Pre diabetes:          5.7%-6.4%  Diabetes:              >6.4%  Glycemic control for   <7.0% adults with diabetes     CBG: Recent Labs  Lab 2020/12/27 1958 2020-12-27 2344 12/18/20 0416 12/18/20 0804 12/18/20 1133  GLUCAP 160* 160* 154* 118* 80    Review of Systems:   Unable to obtain, intubated and sedated   Past Medical History:  He,  has a past medical history of Acute on chronic respiratory failure with hypoxia (HCC), Chronic atrial fibrillation (HCC), Community acquired pneumonia of both lower lobes, COVID-19 virus infection, and Pneumonia due to COVID-19 virus.   Surgical History:  Unavailable   Social History:      Family History:  His family history is not on file.    Allergies No Known Allergies   Home Medications  Prior to Admission medications   Not on File     Critical care time: 61 min      CRITICAL CARE Performed by: Lesia Sago Cleaster Shiffer   Total critical care time: 41 minutes  Critical care time was exclusive of separately billable procedures and treating other patients. Critical care was necessary to treat or prevent imminent or life-threatening deterioration.  Critical care was time spent personally by me on the following activities: development of treatment plan with patient and/or surrogate as well as nursing, discussions with consultants, evaluation of patient's response to treatment, examination of patient, obtaining history from patient or surrogate, ordering and performing treatments and interventions, ordering and review of laboratory studies, ordering and review of radiographic studies, pulse oximetry and re-evaluation of patient's condition.  Karren Burly, MD

## 2020-12-18 NOTE — Progress Notes (Signed)
eLink Physician-Brief Progress Note Patient Name: Matthew Oliver DOB: 01/12/1951 MRN: [id number]   Date of Service  12/18/2020  HPI/Events of Note  Corrected calcium 8.1 gm / dl.  eICU Interventions  Calcium gluconate 2 gm iv x 1        [name] 12/18/2020, 6:57 AM

## 2020-12-18 NOTE — Progress Notes (Signed)
Patient ID: Matthew Oliver, male   DOB: 27-May-1951, 70 y.o.   MRN: [id number] [facility] Progress Note   Assessment/ Plan:   1.  Acute kidney injury on chronic kidney disease stage III: Likely ATN in the setting of sepsis/septic shock.  Started yesterday (01/06/2021) on CRRT for anuric acute kidney injury for the management of multiple metabolic abnormalities and volume status.  Remains anuric and without any evidence of renal recovery-we will continue CRRT at the current prescription and make adjustments based on volume status/electrolytes. 2.  Hyperkalemia: Secondary to acute kidney injury, this has been corrected with CRRT. 3.  Anion gap metabolic acidosis: Secondary to acute kidney injury and now corrected with CRRT. 4.  Acute respiratory failure with hypoxia/hypercarbia: With right pneumothorax status post chest tube placement on 2/3; ventilator management per CCM service. 5.  Shock: Suspected to be septic shock with cultures obtained and initiation of broad-spectrum antimicrobial therapy with vancomycin and Zosyn.  Evidence of shock liver noted with significantly elevated transaminases.  Subjective:   With episode of nonsustained wide-complex tachycardia overnight; underwent calcium supplementation per CCM.  Tolerated CRRT with UF of about 1 L overnight   Objective:   BP (!) 143/89   Pulse 77   Temp (!) 94.9 F (34.9 C) (Axillary)   Resp (!) 30   Wt 129.1 kg   SpO2 (!) 88%   BMI 40.84 kg/m   Intake/Output Summary (Last 24 hours) at 12/18/2020 0705 Last data filed at 12/18/2020 0600 Gross per 24 hour  Intake 3954.35 ml  Output 4571 ml  Net -616.65 ml   Weight change:   Physical Exam: Gen: Intubated, sedated, appears comfortable CVS: Pulse regular rhythm, normal rate, S1 and S2 normal Resp: Anteriorly clear to auscultation, no rales/rhonchi Abd: Soft, obese, nontender, bowel sounds normal Ext: Trace-1+ lower extremity and dependent edema.  Right femoral dialysis  catheter.  Imaging: DG Abd 1 View  Result Date: 12/21/2020 CLINICAL DATA:  70 year old male with with NG. EXAM: ABDOMEN - 1 VIEW COMPARISON:  Abdominal radiograph dated 12/16/2020. FINDINGS: Enteric tube with tip in the distal stomach. Right sided chest tube. Bilateral confluent and streaky airspace opacities noted. A central venous line with tip at the cavoatrial junction. Right femoral line noted. No bowel dilatation or evidence of obstruction. The osseous structures and soft tissues are grossly unremarkable. IMPRESSION: Enteric tube with tip in the distal stomach. Electronically Signed   By: Anner Crete M.D.   On: 12/24/2020 18:29   DG Abd 1 View  Result Date: 12/16/2020 CLINICAL DATA:  Orogastric tube placement. EXAM: ABDOMEN - 1 VIEW COMPARISON:  December 09, 2020. FINDINGS: The bowel gas pattern is normal. Distal tip of nasogastric tube appears to be in the distal stomach. No radio-opaque calculi or other significant radiographic abnormality are seen. IMPRESSION: Distal tip of nasogastric tube appears to be in the distal stomach. Electronically Signed   By: Marijo Conception M.D.   On: 12/16/2020 13:24   US RENAL  Result Date: 01/01/2021 CLINICAL DATA:  Acute renal injury EXAM: RENAL / URINARY TRACT ULTRASOUND COMPLETE COMPARISON:  None. FINDINGS: Right Kidney: Renal measurements: 12.7 x 6.0 x 6.3 cm. = volume: 250 mL. Echogenicity within normal limits. No mass or hydronephrosis visualized. Left Kidney: Renal measurements: 13.9 x 5.6 x 5.8 cm. = volume: 234 mL. Echogenicity within normal limits. No mass or hydronephrosis visualized. Bladder: Decompressed by Foley catheter Other: None. IMPRESSION: Unremarkable kidneys. Electronically Signed   By: Inez Catalina M.D.   On:  12/20/2020 01:30   DG CHEST PORT 1 VIEW  Result Date: 12/31/2020 CLINICAL DATA:  Central line placement. EXAM: PORTABLE CHEST 1 VIEW COMPARISON:  Earlier same day FINDINGS: 1259 hours. Endotracheal tube tip is approximately 4.8 cm  above the base of the carina. The NG tube passes into the stomach although the distal tip position is not included on the film. Right IJ central line is new in the interval with tip overlying the distal SVC level. Right pleural drain again noted. Diffuse bilateral airspace disease is similar to prior. Telemetry leads overlie the chest. IMPRESSION: 1. New right IJ central line tip overlies the distal SVC level near the junction with the RA. No pneumothorax or pleural effusion. 2. Otherwise no substantial interval change in exam. Diffuse bilateral airspace disease. Electronically Signed   By: Misty Stanley M.D.   On: 12/26/2020 13:08   DG Chest Port 1 View  Result Date: 12/20/2020 CLINICAL DATA:  Hypoxemia EXAM: PORTABLE CHEST 1 VIEW COMPARISON:  Yesterday FINDINGS: Endotracheal tube with tip just below the clavicular heads. An enteric tube reaches the stomach. Right-sided chest tube in place. Artifact from EKG leads. Cardiomegaly and confluent airspace disease. No visible effusion or pneumothorax. IMPRESSION: 1. Unremarkable hardware. 2. Unchanged extensive airspace disease. Electronically Signed   By: Monte Fantasia M.D.   On: 01/02/2021 06:10   DG Chest Portable 1 View  Result Date: 12/16/2020 CLINICAL DATA:  Hypoxia. EXAM: PORTABLE CHEST 1 VIEW COMPARISON:  Chest x-ray from same day at 0809 hours. FINDINGS: Endotracheal tube tip 6.1 cm above the carina. Interval retraction of the feeding tube with the tip now in the upper esophagus. Unchanged right-sided chest tube. No residual pneumothorax. Extensive bilateral interstitial and hazy airspace opacities are unchanged. No large pleural effusion. Stable cardiomediastinal silhouette. IMPRESSION: 1. Interval retraction of the feeding tube with the tip now in the upper esophagus. Recommend repositioning. 2. Unchanged multifocal pneumonia. Electronically Signed   By: Titus Dubin M.D.   On: 12/16/2020 11:14   DG Chest Portable 1 View  Result Date:  12/16/2020 CLINICAL DATA:  RIGHT pneumothorax post thoracostomy tube insertion, hypoxia, COVID-19 EXAM: PORTABLE CHEST 1 VIEW COMPARISON:  Portable exam 0809 hours compared to 0557 hours FINDINGS: Tip of endotracheal tube projects 6.9 cm above carina. Feeding tube extends into abdomen. New RIGHT pigtail thoracostomy tube. Near-complete of previously identified RIGHT pneumothorax, tiny residual at lung base. Diffuse BILATERAL pulmonary infiltrates consistent with multifocal pneumonia and COVID-19. IMPRESSION: Near complete resolution of RIGHT pneumothorax following chest tube insertion. Persistent diffuse BILATERAL pulmonary infiltrates consistent with multifocal pneumonia and COVID-19. Electronically Signed   By: Lavonia Dana M.D.   On: 12/16/2020 08:21   ECHOCARDIOGRAM COMPLETE  Result Date: 01/06/2021    ECHOCARDIOGRAM REPORT   Patient Name:   Matthew Oliver Date of Exam: 12/15/2020 Medical Rec #:  [id number]      Height:       70.0 in Accession #:    [id number]     Weight:       264.6 lb Date of Birth:  07-24-1951       BSA:          2.351 m Patient Age:    58 years       BP:           146/63 mmHg Patient Gender: M              HR:           97 bpm. Exam Location:  Inpatient Procedure: 2D Echo, Color Doppler and Cardiac Doppler Indications:    [facility]) [[id number]]  History:        Patient has no prior history of Echocardiogram examinations.                 CHF, Arrythmias:Atrial Fibrillation; Risk Factors:Hypertension                 and Dyslipidemia. Admitted to select hospital after suffering                 acute respiratory failure [id number] pneumonia complicated by                 development of right-sided pneumothorax status post chest tube                 placement.  Sonographer:    Darlina Sicilian RDCS Referring Phys: [id number] GRACE E BOWSER  Sonographer Comments: Technically challenging study due to limited acoustic windows. IMPRESSIONS  1. Left ventricular ejection fraction, by estimation, is 65 to 70%. The  left ventricle has normal function. The left ventricle has no regional wall motion abnormalities. There is mild left ventricular hypertrophy. Left ventricular diastolic parameters are indeterminate.  2. Right ventricular systolic function is normal. The right ventricular size is normal.  3. The pericardial effusion is circumferential.  4. The mitral valve is normal in structure. No evidence of mitral valve regurgitation. No evidence of mitral stenosis.  5. The aortic valve is normal in structure. Aortic valve regurgitation is not visualized. Mild aortic valve sclerosis is present, with no evidence of aortic valve stenosis.  6. The inferior vena cava is dilated in size with >50% respiratory variability, suggesting right atrial pressure of 8 mmHg. FINDINGS  Left Ventricle: Left ventricular ejection fraction, by estimation, is 65 to 70%. The left ventricle has normal function. The left ventricle has no regional wall motion abnormalities. The left ventricular internal cavity size was normal in size. There is  mild left ventricular hypertrophy. Left ventricular diastolic parameters are indeterminate. Right Ventricle: The right ventricular size is normal. No increase in right ventricular wall thickness. Right ventricular systolic function is normal. Left Atrium: Left atrial size was normal in size. Right Atrium: Right atrial size was normal in size. Pericardium: Trivial pericardial effusion is present. The pericardial effusion is circumferential. Mitral Valve: The mitral valve is normal in structure. There is moderate thickening of the mitral valve leaflet(s). No evidence of mitral valve regurgitation. No evidence of mitral valve stenosis. Tricuspid Valve: The tricuspid valve is normal in structure. Tricuspid valve regurgitation is trivial. No evidence of tricuspid stenosis. Aortic Valve: The aortic valve is normal in structure. Aortic valve regurgitation is not visualized. Mild aortic valve sclerosis is present, with no  evidence of aortic valve stenosis. Pulmonic Valve: The pulmonic valve was normal in structure. Pulmonic valve regurgitation is not visualized. No evidence of pulmonic stenosis. Aorta: The aortic root is normal in size and structure. Venous: The inferior vena cava is dilated in size with greater than 50% respiratory variability, suggesting right atrial pressure of 8 mmHg. IAS/Shunts: No atrial level shunt detected by color flow Doppler.  LEFT VENTRICLE PLAX 2D LVIDd:         3.80 cm LVIDs:         2.40 cm LV PW:         1.30 cm LV IVS:        1.20 cm LVOT diam:     1.90 cm LVOT Area:  2.84 cm   AORTA Ao Root diam: 3.40 cm  SHUNTS Systemic Diam: 1.90 cm Candee Furbish MD Electronically signed by Candee Furbish MD Signature Date/Time: 12/20/2020/3:39:59 PM    Final    US LIVER DOPPLER  Result Date: 01/10/2021 CLINICAL DATA:  Abnormal liver function tests, respiratory failure secondary to COVID-19 pneumonia and renal failure. EXAM: DUPLEX ULTRASOUND OF LIVER TECHNIQUE: Color and duplex Doppler ultrasound was performed to evaluate the hepatic in-flow and out-flow vessels. COMPARISON:  None. FINDINGS: Portal Vein Velocities Main:  9-14 cm/sec Right:  31 cm/sec Left:  27 cm/sec Hepatic Vein Velocities Right:  20 cm/sec Middle:  19 cm/sec Left:  21 cm/sec Hepatic Artery Velocity:  343 cm/sec Varices: None visualized. Ascites: None visualized. There is flow in the portal vein but flow appears attenuated and of low velocity, suggestive of portal hypertension. No definite portal vein thrombus identified. No evidence of hepatic veno-occlusive disease. The intrahepatic IVC demonstrates normal patency. IMPRESSION: Low main portal vein velocities without definite portal vein thrombus. Findings are suggestive of portal hypertension. Electronically Signed   By: Aletta Edouard M.D.   On: 01/08/2021 17:49    Labs: BMET Recent Labs  Lab 12/13/20 1048 12/16/20 0516 12/16/20 0922 12/16/20 1425 12/24/2020 0509 12/22/2020 1259  12/22/2020 1421 01/06/2021 1640 12/29/2020 2129 12/18/20 0412  NA 149* 154*   < > 147* 147* 143 141 144 142 139  K 3.5 5.6*   < > 6.1* 5.6* 4.9 4.5 4.9 4.3 4.7  CL 105 107  --  104 103 100  --  100  --  98  CO2 28 25  --  19* 21* 22  --  22  --  21*  GLUCOSE 163* 153*  --  356* 295* 277*  --  191*  --  148*  BUN 69* 137*  --  136* 159* 160*  --  162*  --  113*  CREATININE 1.33* 3.41*  --  3.99* 4.90* 5.45*  --  5.44*  --  3.92*  CALCIUM 8.2* 8.3*  --  6.9* 5.3* 5.2*  --  5.4*  --  6.3*  PHOS  --   --   --   --   --   --   --  11.5*  --  8.6*   < > = values in this interval not displayed.   CBC Recent Labs  Lab 12/16/20 0516 12/16/20 0922 12/31/2020 0509 01/09/2021 1259 01/08/2021 1421 12/23/2020 2129 12/18/20 0005  WBC 19.2*  --  30.9* 32.1*  --   --  40.3*  HGB 18.4*   < > 13.9 12.9* 12.6* 13.6 14.0  HCT 62.2*   < > 44.1 40.0 37.0* 40.0 44.6  MCV 107.4*  --  104.8* 101.5*  --   --  103.0*  PLT 181  --  PLATELET CLUMPS NOTED ON SMEAR, COUNT APPEARS DECREASED 58*  56*  --   --  54*   < > = values in this interval not displayed.    Medications:    . artificial tears  1 application Both Eyes M4Q  . chlorhexidine gluconate (MEDLINE KIT)  15 mL Mouth Rinse BID  . Chlorhexidine Gluconate Cloth  6 each Topical Daily  . docusate  100 mg Per Tube BID  . feeding supplement (PROSource TF)  90 mL Per Tube TID  . fentaNYL (SUBLIMAZE) injection  25 mcg Intravenous Once  . hydrocortisone sod succinate (SOLU-CORTEF) inj  50 mg Intravenous Q6H  . insulin aspart  0-15 Units Subcutaneous  Q4H  . mouth rinse  15 mL Mouth Rinse 10 times per day  . pantoprazole (PROTONIX) IV  40 mg Intravenous QHS  . polyethylene glycol  17 g Per Tube Daily   Elmarie Shiley, MD 12/18/2020, 7:05 AM

## 2020-12-18 NOTE — Progress Notes (Signed)
eLink Physician-Brief Progress Note Patient Name: Matthew Oliver DOB: 1951/04/10 MRN: [id number]   Date of Service  12/18/2020  HPI/Events of Note  Hypothermia. Temperature 35.9.  eICU Interventions  Bair hugger ordered.        Thomasene Lot Isabel Freese 12/18/2020, 3:27 AM

## 2020-12-18 NOTE — Consult Note (Signed)
Matthew Oliver for Infectious Disease    Date of Admission:  01/08/2021   Total days of antibiotics: 2 vanco/zosyn --> zosyn/zyvox               Reason for Consult: VRE Bacteremia    Referring Provider: CHAMP!   Assessment: VRE Bacteremia Acute on Chronic Resp Failure Aspiration pneumonia, HCAP  Trach aspirate- rare GPC Pneumothorax COVID 19 (1-14) AKI, ATN due to sepsis, CKD3  Metabolic acidosis with anion gap Epistaxis Shock liver [place]: 1. Repeat BCx 2. Consider TEE 3. Continue zyvox 4. Continue zosyn 5. Please remove central line and insert new 6. Remove peripheral lines  Comment Would not call him CAP, he has been in healthcare setting.  His line is from [facility], would change with his bacteremia.  Watch closely, he is at very high risk, needing pressor support.  Hopefully stopping vanco will help improve his AKI  Thank you so much for this interesting consult,  Active Problems:   Acute renal failure (ARF) ([place])   . artificial tears  1 application Both Eyes E4M  . chlorhexidine gluconate (MEDLINE KIT)  15 mL Mouth Rinse BID  . Chlorhexidine Gluconate Cloth  6 each Topical Daily  . docusate  100 mg Per Tube BID  . feeding supplement (PROSource TF)  90 mL Per Tube TID  . fentaNYL (SUBLIMAZE) injection  25 mcg Intravenous Once  . hydrocortisone sod succinate (SOLU-CORTEF) inj  50 mg Intravenous Q6H  . insulin aspart  0-15 Units Subcutaneous Q4H  . mouth rinse  15 mL Mouth Rinse 10 times per day  . pantoprazole (PROTONIX) IV  40 mg Intravenous QHS  . polyethylene glycol  17 g Per Tube Daily    HPI: Matthew Oliver is a 70 y.o. male with hx of chronic resp failure, prev at [facility]. He was dx with COVID 1-14. Over week pta he was requiring increasing O2 as well as bipap. He required intbx on 2-2.  He was brought to [facility] on 2-3 with R sided pneumothorax.He had chest tube placement with resolution of pneumothroax, required repeat  intubation due to a "blown cuff", however he became hypotensive and has been adm to ICU (on pressors). At this point he returned to [facility].  He has also been noted to have AKI (1.3--> 3.4--> 5.45) and shock, was transferred back to [facility], ICU.  He was started on vanco/zosyn by CCM on arrival 2-4. He had HD cath placed 2-4. He was started on CRRT.  He has continued to be hypoxic and hitric oxide was dded 2-4.  Today he was found to have VRE BCx, changed to zyvox.  He is also hypocalcemic and hypothermic.   Review of Systems: Review of Systems  Unable to perform ROS: Critical illness  Constitutional: Negative for fever.  Gastrointestinal: Negative for diarrhea.  small bm per nursing.   Past Medical History:  Diagnosis Date  . Acute on chronic respiratory failure with hypoxia ([place])   . Chronic atrial fibrillation ([place])   . Community acquired pneumonia of both lower lobes   . COVID-19 virus infection   . Pneumonia due to COVID-19 virus     Social History   Tobacco Use  . Smoking status: Unknown If Ever Smoked    History reviewed. No pertinent family history.   Medications:  Scheduled: . artificial tears  1 application Both Eyes P5T  . chlorhexidine gluconate (MEDLINE KIT)  15 mL Mouth Rinse BID  .  Chlorhexidine Gluconate Cloth  6 each Topical Daily  . docusate  100 mg Per Tube BID  . feeding supplement (PROSource TF)  90 mL Per Tube TID  . fentaNYL (SUBLIMAZE) injection  25 mcg Intravenous Once  . hydrocortisone sod succinate (SOLU-CORTEF) inj  50 mg Intravenous Q6H  . insulin aspart  0-15 Units Subcutaneous Q4H  . mouth rinse  15 mL Mouth Rinse 10 times per day  . pantoprazole (PROTONIX) IV  40 mg Intravenous QHS  . polyethylene glycol  17 g Per Tube Daily    Abtx:  Anti-infectives (From admission, onward)   Start     Dose/Rate Route Frequency Ordered Stop   12/18/20 1600  vancomycin (VANCOREADY) IVPB 1500 mg/300 mL  Status:  Discontinued        1,500 mg 150 mL/hr over  120 Minutes Intravenous Every 24 hours 01/07/2021 1553 12/18/20 1040   12/18/20 1130  linezolid (ZYVOX) IVPB 600 mg        600 mg 300 mL/hr over 60 Minutes Intravenous Every 12 hours 12/18/20 1040     12/15/2020 1800  piperacillin-tazobactam (ZOSYN) IVPB 3.375 g        3.375 g 100 mL/hr over 30 Minutes Intravenous Every 6 hours 12/16/2020 1553     12/31/2020 1345  piperacillin-tazobactam (ZOSYN) IVPB 2.25 g  Status:  Discontinued        2.25 g 100 mL/hr over 30 Minutes Intravenous Every 8 hours 12/31/2020 1252 12/23/2020 1553   01/05/2021 1345  vancomycin (VANCOCIN) 2,500 mg in sodium chloride 0.9 % 500 mL IVPB        2,500 mg 250 mL/hr over 120 Minutes Intravenous  Once 01/06/2021 1252 12/18/20 0956   01/05/2021 1251  vancomycin variable dose per unstable renal function (pharmacist dosing)  Status:  Discontinued         Does not apply See admin instructions 01/10/2021 1252 01/07/2021 1600        OBJECTIVE: Blood pressure 109/85, pulse 99, temperature (!) 94.9 F (34.9 C), temperature source Axillary, resp. rate (!) 30, weight 129.1 kg, SpO2 (!) 89 %.  Physical Exam Constitutional:      Appearance: He is toxic-appearing.  HENT:     Nose:     Comments: Epistaxis noted, crusted blood Eyes:     General: Scleral icterus present.  Cardiovascular:     Rate and Rhythm: Tachycardia present.  Pulmonary:     Breath sounds: Rhonchi present.  Chest:    Abdominal:     General: Bowel sounds are normal. There is no distension.     Palpations: Abdomen is soft.     Tenderness: There is no abdominal tenderness.  Musculoskeletal:     Right lower leg: No edema.     Left lower leg: No edema.     Lab Results Results for orders placed or performed during the hospital encounter of 12/23/2020 (from the past 48 hour(s))  HIV Antibody (routine testing w rflx)     Status: None   Collection Time: 12/25/2020 12:59 PM  Result Value Ref Range   HIV Screen 4th Generation wRfx Non Reactive Non Reactive    Comment:  Performed at [facility], [place] [address], [place],  [address]  Comprehensive metabolic panel     Status: Abnormal   Collection Time: 12/23/2020 12:59 PM  Result Value Ref Range   Sodium 143 135 - 145 mmol/L   Potassium 4.9 3.5 - 5.1 mmol/L   Chloride 100 98 - 111 mmol/L  CO2 22 22 - 32 mmol/L   Glucose, Bld 277 (H) 70 - 99 mg/dL    Comment: Glucose reference range applies only to samples taken after fasting for at least 8 hours.   BUN 160 (H) 8 - 23 mg/dL   Creatinine, Ser 5.45 (H) 0.61 - 1.24 mg/dL   Calcium 5.2 (LL) 8.9 - 10.3 mg/dL    Comment: CRITICAL RESULT CALLED TO, READ BACK BY AND VERIFIED WITHLinwood Dibbles RN [phone] BY A BENNETT    Total Protein 4.9 (L) 6.5 - 8.1 g/dL   Albumin 1.5 (L) 3.5 - 5.0 g/dL   AST 3,276 (H) 15 - 41 U/L    Comment: RESULTS CONFIRMED BY MANUAL DILUTION   ALT 2,332 (H) 0 - 44 U/L    Comment: RESULTS CONFIRMED BY MANUAL DILUTION   Alkaline Phosphatase 98 38 - 126 U/L   Total Bilirubin 3.1 (H) 0.3 - 1.2 mg/dL   GFR, Estimated 11 (L) >60 mL/min    Comment: (NOTE) Calculated using the CKD-EPI Creatinine Equation (2021)    Anion gap 21 (H) 5 - 15    Comment: Performed at [facility] [address] [place], [place] [address]  Magnesium     Status: Abnormal   Collection Time: 12/25/2020 12:59 PM  Result Value Ref Range   Magnesium 2.8 (H) 1.7 - 2.4 mg/dL    Comment: Performed at [facility] [address] [place], [place] [address]  Lactic acid, plasma     Status: Abnormal   Collection Time: 01/09/2021 12:59 PM  Result Value Ref Range   Lactic Acid, Venous 4.7 (HH) 0.5 - 1.9 mmol/L    Comment: CRITICAL VALUE NOTED.  VALUE IS CONSISTENT WITH PREVIOUSLY REPORTED AND CALLED VALUE. Performed at [facility], [place] [address], [place], [place] [address]   Brain natriuretic peptide     Status: Abnormal   Collection Time: 12/24/2020 12:59 PM  Result Value Ref Range   B Natriuretic Peptide 111.6 (H) 0.0 - 100.0 pg/mL     Comment: Performed at [facility] [address], [place], [place] [address]  Cortisol     Status: None   Collection Time: 12/26/2020 12:59 PM  Result Value Ref Range   Cortisol, Plasma 9.9 ug/dL    Comment: (NOTE) AM    6.7 - 22.6 ug/dL PM   <10.0       ug/dL Performed at [facility] [address] [place], [place] [address]   CBC     Status: Abnormal   Collection Time: 12/31/2020 12:59 PM  Result Value Ref Range   WBC 32.1 (H) 4.0 - 10.5 K/uL   RBC 3.94 (L) 4.22 - 5.81 MIL/uL   Hemoglobin 12.9 (L) 13.0 - 17.0 g/dL   HCT 40.0 39.0 - 52.0 %   MCV 101.5 (H) 80.0 - 100.0 fL   MCH 32.7 26.0 - 34.0 pg   MCHC 32.3 30.0 - 36.0 g/dL   RDW 14.7 11.5 - 15.5 %   Platelets 56 (L) 150 - 400 K/uL    Comment: REPEATED TO VERIFY Immature Platelet Fraction may be clinically indicated, consider ordering this additional test [id number] CONSISTENT WITH PREVIOUS RESULT    nRBC 0.2 0.0 - 0.2 %    Comment: Performed at [facility], [place] [address], [place],  [address]  DIC Panel (Not at [facility]) ONCE - STAT     Status: Abnormal   Collection Time: 12/29/2020 12:59  PM  Result Value Ref Range   Prothrombin Time 53.6 (H) 11.4 - 15.2 seconds   INR 6.3 (HH) 0.8 - 1.2    Comment: REPEATED TO VERIFY CRITICAL RESULT CALLED TO, READ BACK BY AND VERIFIED WITH: C.RICE,RN '@[id number]'  12/17/2020 VANG.J (NOTE) INR goal varies based on device and disease states.    aPTT 37 (H) 24 - 36 seconds    Comment:        IF BASELINE aPTT IS ELEVATED, SUGGEST PATIENT RISK ASSESSMENT BE USED TO DETERMINE APPROPRIATE ANTICOAGULANT THERAPY.    Fibrinogen 254 210 - 475 mg/dL   D-Dimer, Quant 13.10 (H) 0.00 - 0.50 ug/mL-FEU    Comment: (NOTE) At the manufacturer cut-off value of 0.5 g/mL FEU, this assay has a negative predictive value of 95-100%.This assay is intended for use in conjunction with a clinical pretest probability (PTP) assessment model to exclude pulmonary embolism (PE) and deep venous  thrombosis (DVT) in outpatients suspected of PE or DVT. Results should be correlated with clinical presentation.    Platelets 58 (L) 150 - 400 K/uL    Comment: Immature Platelet Fraction may be clinically indicated, consider ordering this additional test [id number]    Smear Review NO SCHISTOCYTES SEEN     Comment: Performed at [facility], [place] [address] [place], [place] [address]  Hemoglobin A1c     Status: Abnormal   Collection Time: 01/09/2021 12:59 PM  Result Value Ref Range   Hgb A1c MFr Bld 6.8 (H) 4.8 - 5.6 %    Comment: (NOTE) Pre diabetes:          5.7%-6.4%  Diabetes:              >6.4%  Glycemic control for   <7.0% adults with diabetes    Mean Plasma Glucose 148.46 mg/dL    Comment: Performed at [facility] [address], [place], [place] [address]  Culture, blood (routine x 2)     Status: None (Preliminary result)   Collection Time: 01/05/2021  1:08 PM   Specimen: BLOOD  Result Value Ref Range   Specimen Description BLOOD FEMORAL ARTERY    Special Requests      BOTTLES DRAWN AEROBIC AND ANAEROBIC Blood Culture adequate volume   Culture  Setup Time      GRAM POSITIVE COCCI IN PAIRS IN CHAINS AEROBIC BOTTLE ONLY CRITICAL RESULT CALLED TO, READ BACK BY AND VERIFIED WITH: Alanda Slim PHARMD '@1035'  12/18/20 EB Performed at [facility], [place] [address] [place], [place] [address]    Culture GRAM POSITIVE COCCI    Report Status PENDING   Blood Culture ID Panel (Reflexed)     Status: Abnormal   Collection Time: 12/22/2020  1:08 PM  Result Value Ref Range   Enterococcus faecalis NOT DETECTED NOT DETECTED   Enterococcus Faecium DETECTED (A) NOT DETECTED    Comment: CRITICAL RESULT CALLED TO, READ BACK BY AND VERIFIED WITH: CATHY PIERCE PHARMD '@1035'  12/18/20 EB    Listeria monocytogenes NOT DETECTED NOT DETECTED   Staphylococcus species NOT DETECTED NOT DETECTED   Staphylococcus aureus (BCID) NOT DETECTED NOT DETECTED   Staphylococcus epidermidis NOT  DETECTED NOT DETECTED   Staphylococcus lugdunensis NOT DETECTED NOT DETECTED   Streptococcus species NOT DETECTED NOT DETECTED   Streptococcus agalactiae NOT DETECTED NOT DETECTED   Streptococcus pneumoniae NOT DETECTED NOT DETECTED   Streptococcus pyogenes NOT DETECTED NOT DETECTED   A.calcoaceticus-baumannii NOT DETECTED NOT DETECTED   Bacteroides fragilis NOT DETECTED NOT DETECTED  Enterobacterales NOT DETECTED NOT DETECTED   Enterobacter cloacae complex NOT DETECTED NOT DETECTED   Escherichia coli NOT DETECTED NOT DETECTED   Klebsiella aerogenes NOT DETECTED NOT DETECTED   Klebsiella oxytoca NOT DETECTED NOT DETECTED   Klebsiella pneumoniae NOT DETECTED NOT DETECTED   Proteus species NOT DETECTED NOT DETECTED   Salmonella species NOT DETECTED NOT DETECTED   Serratia marcescens NOT DETECTED NOT DETECTED   Haemophilus influenzae NOT DETECTED NOT DETECTED   Neisseria meningitidis NOT DETECTED NOT DETECTED   Pseudomonas aeruginosa NOT DETECTED NOT DETECTED   Stenotrophomonas maltophilia NOT DETECTED NOT DETECTED   Candida albicans NOT DETECTED NOT DETECTED   Candida auris NOT DETECTED NOT DETECTED   Candida glabrata NOT DETECTED NOT DETECTED   Candida krusei NOT DETECTED NOT DETECTED   Candida parapsilosis NOT DETECTED NOT DETECTED   Candida tropicalis NOT DETECTED NOT DETECTED   Cryptococcus neoformans/gattii NOT DETECTED NOT DETECTED   Vancomycin resistance DETECTED (A) NOT DETECTED    Comment: CRITICAL RESULT CALLED TO, READ BACK BY AND VERIFIED WITH: CATHY PIERCE PHARMD '@1035'  12/18/20 EB Performed at [facility] [address], [place], [place] [address]   Glucose, capillary     Status: Abnormal   Collection Time: 12/16/2020  1:24 PM  Result Value Ref Range   Glucose-Capillary 225 (H) 70 - 99 mg/dL    Comment: Glucose reference range applies only to samples taken after fasting for at least 8 hours.  Culture, blood (routine x 2)     Status: None (Preliminary result)    Collection Time: 12/16/2020  1:40 PM   Specimen: BLOOD LEFT FOREARM  Result Value Ref Range   Specimen Description BLOOD LEFT FOREARM    Special Requests      BOTTLES DRAWN AEROBIC AND ANAEROBIC Blood Culture adequate volume   Culture      NO GROWTH < 24 HOURS Performed at [facility], [place] [address], [place], [place] [address]    Report Status PENDING   I-STAT 7, (LYTES, BLD GAS, ICA, H+H)     Status: Abnormal   Collection Time: 01/02/2021  2:21 PM  Result Value Ref Range   pH, Arterial 7.260 (L) 7.350 - 7.450   pCO2 arterial 57.6 (H) 32.0 - 48.0 mmHg   pO2, Arterial 50 (L) 83.0 - 108.0 mmHg   Bicarbonate 25.9 20.0 - 28.0 mmol/L   TCO2 28 22 - 32 mmol/L   O2 Saturation 78.0 %   Acid-base deficit 2.0 0.0 - 2.0 mmol/L   Sodium 141 135 - 145 mmol/L   Potassium 4.5 3.5 - 5.1 mmol/L   Calcium, Ion 0.68 (LL) 1.15 - 1.40 mmol/L   HCT 37.0 (L) 39.0 - 52.0 %   Hemoglobin 12.6 (L) 13.0 - 17.0 g/dL   Patient temperature 98.6 F    Collection site Magazine features editor by Operator    Sample type ARTERIAL    Comment NOTIFIED PHYSICIAN   Hepatitis panel, acute     Status: None   Collection Time: 01/01/2021  3:19 PM  Result Value Ref Range   Hepatitis B Surface Ag NON REACTIVE NON REACTIVE   HCV Ab NON REACTIVE NON REACTIVE    Comment: (NOTE) Nonreactive HCV antibody screen is consistent with no HCV infections,  unless recent infection is suspected or other evidence exists to indicate HCV infection.     Hep A IgM NON REACTIVE NON REACTIVE   Hep B C IgM NON REACTIVE NON REACTIVE  Comment: Performed at [facility], [place] [address], [place], [place] [address]  Lactic acid, plasma     Status: Abnormal   Collection Time: 12/26/2020  4:40 PM  Result Value Ref Range   Lactic Acid, Venous 4.1 (HH) 0.5 - 1.9 mmol/L    Comment: CRITICAL VALUE NOTED.  VALUE IS CONSISTENT WITH PREVIOUSLY REPORTED AND CALLED VALUE. Performed at [facility], [place] [address], [place], [place] [address]    Renal function panel (daily at 1600)     Status: Abnormal   Collection Time: 12/22/2020  4:40 PM  Result Value Ref Range   Sodium 144 135 - 145 mmol/L   Potassium 4.9 3.5 - 5.1 mmol/L   Chloride 100 98 - 111 mmol/L   CO2 22 22 - 32 mmol/L   Glucose, Bld 191 (H) 70 - 99 mg/dL    Comment: Glucose reference range applies only to samples taken after fasting for at least 8 hours.   BUN 162 (H) 8 - 23 mg/dL   Creatinine, Ser 5.44 (H) 0.61 - 1.24 mg/dL   Calcium 5.4 (LL) 8.9 - 10.3 mg/dL    Comment: CRITICAL RESULT CALLED TO, READ BACK BY AND VERIFIED WITH:  J. BEABRAUT RN@ [phone] 01/03/2021 K. SANDERS    Phosphorus 11.5 (H) 2.5 - 4.6 mg/dL   Albumin 1.5 (L) 3.5 - 5.0 g/dL   GFR, Estimated 11 (L) >60 mL/min    Comment: (NOTE) Calculated using the CKD-EPI Creatinine Equation (2021)    Anion gap 22 (H) 5 - 15    Comment: Performed at [facility] [address] [place], [place] [address]  .Cooxemetry Panel (carboxy, met, total hgb, O2 sat)     Status: Abnormal   Collection Time: 12/31/2020  5:00 PM  Result Value Ref Range   Total hemoglobin 11.9 (L) 12.0 - 16.0 g/dL   O2 Saturation 79.1 %   Carboxyhemoglobin 0.8 0.5 - 1.5 %   Methemoglobin 1.3 0.0 - 1.5 %    Comment: Performed at [facility] [address], [place], [place] [address]  Glucose, capillary     Status: Abnormal   Collection Time: 12/15/2020  7:58 PM  Result Value Ref Range   Glucose-Capillary 160 (H) 70 - 99 mg/dL    Comment: Glucose reference range applies only to samples taken after fasting for at least 8 hours.  Culture, respiratory (tracheal aspirate)     Status: None (Preliminary result)   Collection Time: 01/09/2021  8:54 PM   Specimen: Tracheal Aspirate; Respiratory  Result Value Ref Range   Specimen Description TRACHEAL ASPIRATE    Special Requests NONE    Gram Stain      NO WBC SEEN RARE GRAM POSITIVE COCCI Performed at [facility], [place] [address] [place], [place] [address]    Culture PENDING     Report Status PENDING   I-STAT 7, (LYTES, BLD GAS, ICA, H+H)     Status: Abnormal   Collection Time: 12/23/2020  9:29 PM  Result Value Ref Range   pH, Arterial 7.261 (L) 7.350 - 7.450   pCO2 arterial 56.5 (H) 32.0 - 48.0 mmHg   pO2, Arterial 55 (L) 83.0 - 108.0 mmHg   Bicarbonate 25.4 20.0 - 28.0 mmol/L   TCO2 27 22 - 32 mmol/L   O2 Saturation 82.0 %   Acid-base deficit 3.0 (H) 0.0 - 2.0 mmol/L   Sodium 142 135 - 145 mmol/L   Potassium 4.3 3.5 - 5.1  mmol/L   Calcium, Ion 0.74 (LL) 1.15 - 1.40 mmol/L   HCT 40.0 39.0 - 52.0 %   Hemoglobin 13.6 13.0 - 17.0 g/dL   Sample type ARTERIAL    Comment NOTIFIED PHYSICIAN   Glucose, capillary     Status: Abnormal   Collection Time: 12/30/2020 11:44 PM  Result Value Ref Range   Glucose-Capillary 160 (H) 70 - 99 mg/dL    Comment: Glucose reference range applies only to samples taken after fasting for at least 8 hours.  CBC     Status: Abnormal   Collection Time: 12/18/20 12:05 AM  Result Value Ref Range   WBC 40.3 (H) 4.0 - 10.5 K/uL   RBC 4.33 4.22 - 5.81 MIL/uL   Hemoglobin 14.0 13.0 - 17.0 g/dL   HCT 44.6 39.0 - 52.0 %   MCV 103.0 (H) 80.0 - 100.0 fL   MCH 32.3 26.0 - 34.0 pg   MCHC 31.4 30.0 - 36.0 g/dL   RDW 14.8 11.5 - 15.5 %   Platelets 54 (L) 150 - 400 K/uL    Comment: REPEATED TO VERIFY Immature Platelet Fraction may be clinically indicated, consider ordering this additional test [id number] CONSISTENT WITH PREVIOUS RESULT    nRBC 0.2 0.0 - 0.2 %    Comment: Performed at [facility], [place] [address] [place], [place] [address]  Hepatic function panel     Status: Abnormal   Collection Time: 12/18/20 12:05 AM  Result Value Ref Range   Total Protein 5.5 (L) 6.5 - 8.1 g/dL   Albumin 1.8 (L) 3.5 - 5.0 g/dL   AST 2,818 (H) 15 - 41 U/L    Comment: RESULTS CONFIRMED BY MANUAL DILUTION   ALT 2,330 (H) 0 - 44 U/L    Comment: RESULTS CONFIRMED BY MANUAL DILUTION   Alkaline Phosphatase 111 38 - 126 U/L   Total Bilirubin 3.9 (H)  0.3 - 1.2 mg/dL   Bilirubin, Direct 2.2 (H) 0.0 - 0.2 mg/dL   Indirect Bilirubin 1.7 (H) 0.3 - 0.9 mg/dL    Comment: Performed at [facility] [address], [place], [place] [address]  Magnesium     Status: Abnormal   Collection Time: 12/18/20 12:05 AM  Result Value Ref Range   Magnesium 2.7 (H) 1.7 - 2.4 mg/dL    Comment: Performed at [facility] [address] [place], [place] [address]  Renal function panel (daily at 0500)     Status: Abnormal   Collection Time: 12/18/20  4:12 AM  Result Value Ref Range   Sodium 139 135 - 145 mmol/L   Potassium 4.7 3.5 - 5.1 mmol/L   Chloride 98 98 - 111 mmol/L   CO2 21 (L) 22 - 32 mmol/L   Glucose, Bld 148 (H) 70 - 99 mg/dL    Comment: Glucose reference range applies only to samples taken after fasting for at least 8 hours.   BUN 113 (H) 8 - 23 mg/dL   Creatinine, Ser 3.92 (H) 0.61 - 1.24 mg/dL   Calcium 6.3 (LL) 8.9 - 10.3 mg/dL    Comment: CRITICAL RESULT CALLED TO, READ BACK BY AND VERIFIED WITH: Sandford Craze RN [id number] [phone] M GARRETT    Phosphorus 8.6 (H) 2.5 - 4.6 mg/dL   Albumin 1.8 (L) 3.5 - 5.0 g/dL   GFR, Estimated 16 (L) >60 mL/min    Comment: (NOTE) Calculated using the CKD-EPI Creatinine Equation (2021)    Anion gap 20 (H) 5 - 15  Comment: Performed at [facility], [place] [address] [place], [place] [address]  Triglycerides     Status: Abnormal   Collection Time: 12/18/20  4:12 AM  Result Value Ref Range   Triglycerides 219 (H) <150 mg/dL    Comment: Performed at [facility] [address] [place], [place] [address]  Glucose, capillary     Status: Abnormal   Collection Time: 12/18/20  4:16 AM  Result Value Ref Range   Glucose-Capillary 154 (H) 70 - 99 mg/dL    Comment: Glucose reference range applies only to samples taken after fasting for at least 8 hours.  Glucose, capillary     Status: Abnormal   Collection Time: 12/18/20  8:04 AM  Result Value Ref Range   Glucose-Capillary 118 (H) 70 -  99 mg/dL    Comment: Glucose reference range applies only to samples taken after fasting for at least 8 hours.  I-STAT 7, (LYTES, BLD GAS, ICA, H+H)     Status: Abnormal   Collection Time: 12/18/20  9:15 AM  Result Value Ref Range   pH, Arterial 7.235 (L) 7.350 - 7.450   pCO2 arterial 56.1 (H) 32.0 - 48.0 mmHg   pO2, Arterial 48 (L) 83.0 - 108.0 mmHg   Bicarbonate 24.2 20.0 - 28.0 mmol/L   TCO2 26 22 - 32 mmol/L   O2 Saturation 79.0 %   Acid-base deficit 5.0 (H) 0.0 - 2.0 mmol/L   Sodium 138 135 - 145 mmol/L   Potassium 4.8 3.5 - 5.1 mmol/L   Calcium, Ion 0.93 (L) 1.15 - 1.40 mmol/L   HCT 44.0 39.0 - 52.0 %   Hemoglobin 15.0 13.0 - 17.0 g/dL   Patient temperature 96.0 F    Collection site Magazine features editor by Nurse    Sample type ARTERIAL   Glucose, capillary     Status: None   Collection Time: 12/18/20 11:33 AM  Result Value Ref Range   Glucose-Capillary 80 70 - 99 mg/dL    Comment: Glucose reference range applies only to samples taken after fasting for at least 8 hours.      Component Value Date/Time   SDES TRACHEAL ASPIRATE 01/10/2021 2054   SPECREQUEST NONE 12/18/2020 2054   CULT PENDING 12/16/2020 2054   REPTSTATUS PENDING 01/09/2021 2054   DG Abd 1 View  Result Date: 01/04/2021 CLINICAL DATA:  70 year old male with with NG. EXAM: ABDOMEN - 1 VIEW COMPARISON:  Abdominal radiograph dated 12/16/2020. FINDINGS: Enteric tube with tip in the distal stomach. Right sided chest tube. Bilateral confluent and streaky airspace opacities noted. A central venous line with tip at the cavoatrial junction. Right femoral line noted. No bowel dilatation or evidence of obstruction. The osseous structures and soft tissues are grossly unremarkable. IMPRESSION: Enteric tube with tip in the distal stomach. Electronically Signed   By: Anner Crete M.D.   On: 12/28/2020 18:29   US RENAL  Result Date: 01/08/2021 CLINICAL DATA:  Acute renal injury EXAM: RENAL / URINARY TRACT ULTRASOUND COMPLETE  COMPARISON:  None. FINDINGS: Right Kidney: Renal measurements: 12.7 x 6.0 x 6.3 cm. = volume: 250 mL. Echogenicity within normal limits. No mass or hydronephrosis visualized. Left Kidney: Renal measurements: 13.9 x 5.6 x 5.8 cm. = volume: 234 mL. Echogenicity within normal limits. No mass or hydronephrosis visualized. Bladder: Decompressed by Foley catheter Other: None. IMPRESSION: Unremarkable kidneys. Electronically Signed   By: Inez Catalina M.D.   On: 12/18/2020 01:30   DG Chest [facility]  1 View  Result Date: 12/18/2020 CLINICAL DATA:  Congestive heart failure. EXAM: PORTABLE CHEST 1 VIEW COMPARISON:  December 17, 2020. FINDINGS: The heart size and mediastinal contours are within normal limits. Endotracheal and nasogastric tubes are unchanged in position. Right internal jugular catheter is unchanged. Stable position of right-sided chest tube. No pneumothorax is noted. Stable bilateral lung opacities are noted concerning for multifocal pneumonia. The visualized skeletal structures are unremarkable. IMPRESSION: Stable support apparatus. Stable bilateral lung opacities are noted concerning for multifocal pneumonia. Electronically Signed   By: Marijo Conception M.D.   On: 12/18/2020 08:32   DG CHEST PORT 1 VIEW  Result Date: 01/09/2021 CLINICAL DATA:  Central line placement. EXAM: PORTABLE CHEST 1 VIEW COMPARISON:  Earlier same day FINDINGS: 1259 hours. Endotracheal tube tip is approximately 4.8 cm above the base of the carina. The NG tube passes into the stomach although the distal tip position is not included on the film. Right IJ central line is new in the interval with tip overlying the distal SVC level. Right pleural drain again noted. Diffuse bilateral airspace disease is similar to prior. Telemetry leads overlie the chest. IMPRESSION: 1. New right IJ central line tip overlies the distal SVC level near the junction with the RA. No pneumothorax or pleural effusion. 2. Otherwise no substantial interval change in  exam. Diffuse bilateral airspace disease. Electronically Signed   By: Misty Stanley M.D.   On: 12/28/2020 13:08   DG Chest Port 1 View  Result Date: 12/14/2020 CLINICAL DATA:  Hypoxemia EXAM: PORTABLE CHEST 1 VIEW COMPARISON:  Yesterday FINDINGS: Endotracheal tube with tip just below the clavicular heads. An enteric tube reaches the stomach. Right-sided chest tube in place. Artifact from EKG leads. Cardiomegaly and confluent airspace disease. No visible effusion or pneumothorax. IMPRESSION: 1. Unremarkable hardware. 2. Unchanged extensive airspace disease. Electronically Signed   By: Monte Fantasia M.D.   On: 01/01/2021 06:10   ECHOCARDIOGRAM COMPLETE  Result Date: 12/15/2020    ECHOCARDIOGRAM REPORT   Patient Name:   YONIS CARREON Date of Exam: 12/21/2020 Medical Rec #:  [id number]      Height:       70.0 in Accession #:    [id number]     Weight:       264.6 lb Date of Birth:  Oct 07, 1951       BSA:          2.351 m Patient Age:    18 years       BP:           146/63 mmHg Patient Gender: M              HR:           97 bpm. Exam Location:  Inpatient Procedure: 2D Echo, Color Doppler and Cardiac Doppler Indications:    Shock ([place]) [[id number]]  History:        Patient has no prior history of Echocardiogram examinations.                 CHF, Arrythmias:Atrial Fibrillation; Risk Factors:Hypertension                 and Dyslipidemia. [facility] suffering                 acute respiratory failure OMVEH-20 pneumonia complicated by                 development of right-sided pneumothorax status post chest tube  placement.  Sonographer:    Darlina Sicilian RDCS Referring Phys: [id number] GRACE E BOWSER  Sonographer Comments: Technically challenging study due to limited acoustic windows. IMPRESSIONS  1. Left ventricular ejection fraction, by estimation, is 65 to 70%. The left ventricle has normal function. The left ventricle has no regional wall motion abnormalities. There is mild left  ventricular hypertrophy. Left ventricular diastolic parameters are indeterminate.  2. Right ventricular systolic function is normal. The right ventricular size is normal.  3. The pericardial effusion is circumferential.  4. The mitral valve is normal in structure. No evidence of mitral valve regurgitation. No evidence of mitral stenosis.  5. The aortic valve is normal in structure. Aortic valve regurgitation is not visualized. Mild aortic valve sclerosis is present, with no evidence of aortic valve stenosis.  6. The inferior vena cava is dilated in size with >50% respiratory variability, suggesting right atrial pressure of 8 mmHg. FINDINGS  Left Ventricle: Left ventricular ejection fraction, by estimation, is 65 to 70%. The left ventricle has normal function. The left ventricle has no regional wall motion abnormalities. The left ventricular internal cavity size was normal in size. There is  mild left ventricular hypertrophy. Left ventricular diastolic parameters are indeterminate. Right Ventricle: The right ventricular size is normal. No increase in right ventricular wall thickness. Right ventricular systolic function is normal. Left Atrium: Left atrial size was normal in size. Right Atrium: Right atrial size was normal in size. Pericardium: Trivial pericardial effusion is present. The pericardial effusion is circumferential. Mitral Valve: The mitral valve is normal in structure. There is moderate thickening of the mitral valve leaflet(s). No evidence of mitral valve regurgitation. No evidence of mitral valve stenosis. Tricuspid Valve: The tricuspid valve is normal in structure. Tricuspid valve regurgitation is trivial. No evidence of tricuspid stenosis. Aortic Valve: The aortic valve is normal in structure. Aortic valve regurgitation is not visualized. Mild aortic valve sclerosis is present, with no evidence of aortic valve stenosis. Pulmonic Valve: The pulmonic valve was normal in structure. Pulmonic valve  regurgitation is not visualized. No evidence of pulmonic stenosis. Aorta: The aortic root is normal in size and structure. Venous: The inferior vena cava is dilated in size with greater than 50% respiratory variability, suggesting right atrial pressure of 8 mmHg. IAS/Shunts: No atrial level shunt detected by color flow Doppler.  LEFT VENTRICLE PLAX 2D LVIDd:         3.80 cm LVIDs:         2.40 cm LV PW:         1.30 cm LV IVS:        1.20 cm LVOT diam:     1.90 cm LVOT Area:     2.84 cm   AORTA Ao Root diam: 3.40 cm  SHUNTS Systemic Diam: 1.90 cm Candee Furbish MD Electronically signed by Candee Furbish MD Signature Date/Time: 12/18/2020/3:39:59 PM    Final    US LIVER DOPPLER  Result Date: 01/04/2021 CLINICAL DATA:  Abnormal liver function tests, respiratory failure secondary to COVID-19 pneumonia and renal failure. EXAM: DUPLEX ULTRASOUND OF LIVER TECHNIQUE: Color and duplex Doppler ultrasound was performed to evaluate the hepatic in-flow and out-flow vessels. COMPARISON:  None. FINDINGS: Portal Vein Velocities Main:  9-14 cm/sec Right:  31 cm/sec Left:  27 cm/sec Hepatic Vein Velocities Right:  20 cm/sec Middle:  19 cm/sec Left:  21 cm/sec Hepatic Artery Velocity:  343 cm/sec Varices: None visualized. Ascites: None visualized. There is flow in the portal vein but flow appears attenuated  and of low velocity, suggestive of portal hypertension. No definite portal vein thrombus identified. No evidence of hepatic veno-occlusive disease. The intrahepatic IVC demonstrates normal patency. IMPRESSION: Low main portal vein velocities without definite portal vein thrombus. Findings are suggestive of portal hypertension. Electronically Signed   By: Aletta Edouard M.D.   On: 12/21/2020 17:49   Recent Results (from the past 240 hour(s))  Culture, blood (routine x 2)     Status: None   Collection Time: 12/11/20  2:45 PM   Specimen: BLOOD LEFT HAND  Result Value Ref Range Status   Specimen Description BLOOD LEFT HAND  Final    Special Requests   Final    BOTTLES DRAWN AEROBIC ONLY Blood Culture adequate volume   Culture   Final    NO GROWTH 5 DAYS Performed at [facility], [address] [place] [address], [place], [place] [address]    Report Status 12/16/2020 FINAL  Final  Culture, blood (routine x 2)     Status: None   Collection Time: 12/11/20  2:59 PM   Specimen: BLOOD  Result Value Ref Range Status   Specimen Description BLOOD LEFT ANTECUBITAL  Final   Special Requests   Final    BOTTLES DRAWN AEROBIC ONLY Blood Culture adequate volume   Culture   Final    NO GROWTH 5 DAYS Performed at [facility], [place] [address] [place], [place] [address]    Report Status 12/16/2020 FINAL  Final  Culture, blood (routine x 2)     Status: None (Preliminary result)   Collection Time: 01/08/2021  1:08 PM   Specimen: BLOOD  Result Value Ref Range Status   Specimen Description BLOOD FEMORAL ARTERY  Final   Special Requests   Final    BOTTLES DRAWN AEROBIC AND ANAEROBIC Blood Culture adequate volume   Culture  Setup Time   Final    GRAM POSITIVE COCCI IN PAIRS IN CHAINS AEROBIC BOTTLE ONLY CRITICAL RESULT CALLED TO, READ BACK BY AND VERIFIED WITH: CATHY PIERCE PHARMD '@1035'  12/18/20 EB Performed at [facility], [place] [address] [place], [place] [address]    Culture GRAM POSITIVE COCCI  Final   Report Status PENDING  Incomplete  Blood Culture ID Panel (Reflexed)     Status: Abnormal   Collection Time: 12/21/2020  1:08 PM  Result Value Ref Range Status   Enterococcus faecalis NOT DETECTED NOT DETECTED Final   Enterococcus Faecium DETECTED (A) NOT DETECTED Final    Comment: CRITICAL RESULT CALLED TO, READ BACK BY AND VERIFIED WITH: CATHY PIERCE PHARMD '@1035'  12/18/20 EB    Listeria monocytogenes NOT DETECTED NOT DETECTED Final   Staphylococcus species NOT DETECTED NOT DETECTED Final   Staphylococcus aureus (BCID) NOT DETECTED NOT DETECTED Final   Staphylococcus epidermidis NOT DETECTED NOT DETECTED Final    Staphylococcus lugdunensis NOT DETECTED NOT DETECTED Final   Streptococcus species NOT DETECTED NOT DETECTED Final   Streptococcus agalactiae NOT DETECTED NOT DETECTED Final   Streptococcus pneumoniae NOT DETECTED NOT DETECTED Final   Streptococcus pyogenes NOT DETECTED NOT DETECTED Final   A.calcoaceticus-baumannii NOT DETECTED NOT DETECTED Final   Bacteroides fragilis NOT DETECTED NOT DETECTED Final   Enterobacterales NOT DETECTED NOT DETECTED Final   Enterobacter cloacae complex NOT DETECTED NOT DETECTED Final   Escherichia coli NOT DETECTED NOT DETECTED Final   Klebsiella aerogenes NOT DETECTED NOT DETECTED Final   Klebsiella oxytoca NOT DETECTED NOT DETECTED Final   Klebsiella pneumoniae NOT DETECTED NOT DETECTED Final  Proteus species NOT DETECTED NOT DETECTED Final   Salmonella species NOT DETECTED NOT DETECTED Final   Serratia marcescens NOT DETECTED NOT DETECTED Final   Haemophilus influenzae NOT DETECTED NOT DETECTED Final   Neisseria meningitidis NOT DETECTED NOT DETECTED Final   Pseudomonas aeruginosa NOT DETECTED NOT DETECTED Final   Stenotrophomonas maltophilia NOT DETECTED NOT DETECTED Final   Candida albicans NOT DETECTED NOT DETECTED Final   Candida auris NOT DETECTED NOT DETECTED Final   Candida glabrata NOT DETECTED NOT DETECTED Final   Candida krusei NOT DETECTED NOT DETECTED Final   Candida parapsilosis NOT DETECTED NOT DETECTED Final   Candida tropicalis NOT DETECTED NOT DETECTED Final   Cryptococcus neoformans/gattii NOT DETECTED NOT DETECTED Final   Vancomycin resistance DETECTED (A) NOT DETECTED Final    Comment: CRITICAL RESULT CALLED TO, READ BACK BY AND VERIFIED WITH: CATHY PIERCE PHARMD '@1035'  12/18/20 EB Performed at [facility], [address] [place], [place] [address]   Culture, blood (routine x 2)     Status: None (Preliminary result)   Collection Time: 12/16/2020  1:40 PM   Specimen: BLOOD LEFT FOREARM  Result Value Ref Range Status    Specimen Description BLOOD LEFT FOREARM  Final   Special Requests   Final    BOTTLES DRAWN AEROBIC AND ANAEROBIC Blood Culture adequate volume   Culture   Final    NO GROWTH < 24 HOURS Performed at [facility], [place] [address], [place], [place] [address]    Report Status PENDING  Incomplete  Culture, respiratory (tracheal aspirate)     Status: None (Preliminary result)   Collection Time: 12/20/2020  8:54 PM   Specimen: Tracheal Aspirate; Respiratory  Result Value Ref Range Status   Specimen Description TRACHEAL ASPIRATE  Final   Special Requests NONE  Final   Gram Stain   Final    NO WBC SEEN RARE GRAM POSITIVE COCCI Performed at [facility], [place] [address] [place], [place] [address]    Culture PENDING  Incomplete   Report Status PENDING  Incomplete    Microbiology: Recent Results (from the past 240 hour(s))  Culture, blood (routine x 2)     Status: None   Collection Time: 12/11/20  2:45 PM   Specimen: BLOOD LEFT HAND  Result Value Ref Range Status   Specimen Description BLOOD LEFT HAND  Final   Special Requests   Final    BOTTLES DRAWN AEROBIC ONLY Blood Culture adequate volume   Culture   Final    NO GROWTH 5 DAYS Performed at [facility], [address] [place], [place] [address]    Report Status 12/16/2020 FINAL  Final  Culture, blood (routine x 2)     Status: None   Collection Time: 12/11/20  2:59 PM   Specimen: BLOOD  Result Value Ref Range Status   Specimen Description BLOOD LEFT ANTECUBITAL  Final   Special Requests   Final    BOTTLES DRAWN AEROBIC ONLY Blood Culture adequate volume   Culture   Final    NO GROWTH 5 DAYS Performed at [facility], [place] [address], [place],  [address]    Report Status 12/16/2020 FINAL  Final  Culture, blood (routine x 2)     Status: None (Preliminary result)   Collection Time: 12/14/2020  1:08 PM   Specimen: BLOOD  Result Value Ref Range Status   Specimen Description BLOOD FEMORAL ARTERY  Final    Special Requests  Final    BOTTLES DRAWN AEROBIC AND ANAEROBIC Blood Culture adequate volume   Culture  Setup Time   Final    GRAM POSITIVE COCCI IN PAIRS IN CHAINS AEROBIC BOTTLE ONLY CRITICAL RESULT CALLED TO, READ BACK BY AND VERIFIED WITH: CATHY PIERCE PHARMD '@1035'  12/18/20 EB Performed at [facility], [place] [address] [place], [place] [address]    Culture GRAM POSITIVE COCCI  Final   Report Status PENDING  Incomplete  Blood Culture ID Panel (Reflexed)     Status: Abnormal   Collection Time: 12/26/2020  1:08 PM  Result Value Ref Range Status   Enterococcus faecalis NOT DETECTED NOT DETECTED Final   Enterococcus Faecium DETECTED (A) NOT DETECTED Final    Comment: CRITICAL RESULT CALLED TO, READ BACK BY AND VERIFIED WITH: CATHY PIERCE PHARMD '@1035'  12/18/20 EB    Listeria monocytogenes NOT DETECTED NOT DETECTED Final   Staphylococcus species NOT DETECTED NOT DETECTED Final   Staphylococcus aureus (BCID) NOT DETECTED NOT DETECTED Final   Staphylococcus epidermidis NOT DETECTED NOT DETECTED Final   Staphylococcus lugdunensis NOT DETECTED NOT DETECTED Final   Streptococcus species NOT DETECTED NOT DETECTED Final   Streptococcus agalactiae NOT DETECTED NOT DETECTED Final   Streptococcus pneumoniae NOT DETECTED NOT DETECTED Final   Streptococcus pyogenes NOT DETECTED NOT DETECTED Final   A.calcoaceticus-baumannii NOT DETECTED NOT DETECTED Final   Bacteroides fragilis NOT DETECTED NOT DETECTED Final   Enterobacterales NOT DETECTED NOT DETECTED Final   Enterobacter cloacae complex NOT DETECTED NOT DETECTED Final   Escherichia coli NOT DETECTED NOT DETECTED Final   Klebsiella aerogenes NOT DETECTED NOT DETECTED Final   Klebsiella oxytoca NOT DETECTED NOT DETECTED Final   Klebsiella pneumoniae NOT DETECTED NOT DETECTED Final   Proteus species NOT DETECTED NOT DETECTED Final   Salmonella species NOT DETECTED NOT DETECTED Final   Serratia marcescens NOT DETECTED NOT DETECTED Final    Haemophilus influenzae NOT DETECTED NOT DETECTED Final   Neisseria meningitidis NOT DETECTED NOT DETECTED Final   Pseudomonas aeruginosa NOT DETECTED NOT DETECTED Final   Stenotrophomonas maltophilia NOT DETECTED NOT DETECTED Final   Candida albicans NOT DETECTED NOT DETECTED Final   Candida auris NOT DETECTED NOT DETECTED Final   Candida glabrata NOT DETECTED NOT DETECTED Final   Candida krusei NOT DETECTED NOT DETECTED Final   Candida parapsilosis NOT DETECTED NOT DETECTED Final   Candida tropicalis NOT DETECTED NOT DETECTED Final   Cryptococcus neoformans/gattii NOT DETECTED NOT DETECTED Final   Vancomycin resistance DETECTED (A) NOT DETECTED Final    Comment: CRITICAL RESULT CALLED TO, READ BACK BY AND VERIFIED WITH: CATHY PIERCE PHARMD '@1035'  12/18/20 EB Performed at [facility], [address] [place], [place] [address]   Culture, blood (routine x 2)     Status: None (Preliminary result)   Collection Time: 01/01/2021  1:40 PM   Specimen: BLOOD LEFT FOREARM  Result Value Ref Range Status   Specimen Description BLOOD LEFT FOREARM  Final   Special Requests   Final    BOTTLES DRAWN AEROBIC AND ANAEROBIC Blood Culture adequate volume   Culture   Final    NO GROWTH < 24 HOURS Performed at [facility], [place] [address], [place], [place] [address]    Report Status PENDING  Incomplete  Culture, respiratory (tracheal aspirate)     Status: None (Preliminary result)   Collection Time: 12/24/2020  8:54 PM   Specimen: Tracheal Aspirate; Respiratory  Result Value Ref Range Status  Specimen Description TRACHEAL ASPIRATE  Final   Special Requests NONE  Final   Gram Stain   Final    NO WBC SEEN RARE GRAM POSITIVE COCCI Performed at [facility], [place] [address] [place], [place] [address]    Culture PENDING  Incomplete   Report Status PENDING  Incomplete    Radiographs and labs were personally reviewed by me.   Bobby Rumpf, MD [facility] [phone] 12/18/2020, 1:50 PM

## 2020-12-18 NOTE — Progress Notes (Addendum)
eLink Physician-Brief Progress Note Patient Name: Matthew Oliver DOB: [birth year] MRN: [id number]   Date of Service  12/18/2020  HPI/Events of Note  RN notified me that pressor requirements have gone up. At 7 pm. Patient was on max dose levophed and vasopressin almost max, with 100 mic of Phenylephrine. Patient remains in profound shock and multi organ failure, not too different from day time assessment but now Neosynephrine needs have increased. Art line showing blunted response and cuff pressures are better.    Push 1 amp bicarb Titrate pressors, current map is 90 on cuff pressure Overall very poor prognosis. Seen on camera     Intervention Category Major Interventions: Shock - evaluation and management  Chari Parmenter G Jadynn Epping 12/18/2020, 8:09 PM   9 pm - Runs of VT. On high dose pressors, IV amiodarone and on Abx, steroids, CRRT BMP and mag now Inform when resulted  10:25 pm- Noted K and mag, acceptable and already on CRRT and IV bicarb per renal Continue plan as above, BP is stable at this time  1:25 am Worsening shock, now max on 3 pressors Continues on Abx, steroids, IV bicarb  I called and spoke with wife, Matthew Oliver at [phone] She understands he is gravely ill I was very clear that he could have a cardiac arrest anytime and I would suggest DNR status again since he has been on the most aggressive therapy in the last 48 hours She fully gets it and wants to speak with her 2 children Unfortunately Connie's mother passed away yesterday and the family has been distraught and stressed out She will speak with them and call us back I told her that we do not have additional therapy to offer at this time   1:55 am I was asked to call Matthew Oliver back and place a DNR order Spoke with Matthew Oliver who had called her children Collectively they agree he has gone through a lot and his wishes were to try to do everything , which they know, has been done Since all of those interventions were done and his  condition continues to decline, they agree that he would not want chest compressions or defibrillation in case his heart were to stop They still wish to continue current measures  But want [place] to designate a DNR status  Matthew Oliver had already spoken with ICU RN about this and I spoke with her separately to confirm it They are unable to come in right now and will see if they can come in the morning Matthew Oliver asked she be notified if things change overnight She was very appreciative of the care he has received and the call, I will be available to speak with her again if needed DNR order placed

## 2020-12-18 NOTE — Progress Notes (Signed)
CRITICAL VALUE ALERT  Critical Value:  Ca=6.3  Date & Time Notied:  12/18/2020  0626  Provider Notified: elink  Orders Received/Actions taken: awaiting new orders

## 2020-12-18 NOTE — Progress Notes (Signed)
PHARMACY - PHYSICIAN COMMUNICATION CRITICAL VALUE ALERT - BLOOD CULTURE IDENTIFICATION (BCID)  Matthew Oliver is an 70 y.o. male who presented to [facility]/28/2020 with a chief complaint of sepsis  Assessment:  1 out of 4 bottles growing GPCs in pairs and chains, identified as VRE   Name of physician (or Provider) Contacted: Dr. Judeth Horn  Current antibiotics: Vanc and Zosyn  Changes to prescribed antibiotics recommended:  Recommendations accepted by provider. Switch from Vancomycin to Linezolid  Results for orders placed or performed during the hospital encounter of 12/27/2020  Blood Culture ID Panel (Reflexed) (Collected: 01/07/2021  1:08 PM)  Result Value Ref Range   Enterococcus faecalis NOT DETECTED NOT DETECTED   Enterococcus Faecium DETECTED (A) NOT DETECTED   Listeria monocytogenes NOT DETECTED NOT DETECTED   Staphylococcus species NOT DETECTED NOT DETECTED   Staphylococcus aureus (BCID) NOT DETECTED NOT DETECTED   Staphylococcus epidermidis NOT DETECTED NOT DETECTED   Staphylococcus lugdunensis NOT DETECTED NOT DETECTED   Streptococcus species NOT DETECTED NOT DETECTED   Streptococcus agalactiae NOT DETECTED NOT DETECTED   Streptococcus pneumoniae NOT DETECTED NOT DETECTED   Streptococcus pyogenes NOT DETECTED NOT DETECTED   A.calcoaceticus-baumannii NOT DETECTED NOT DETECTED   Bacteroides fragilis NOT DETECTED NOT DETECTED   Enterobacterales NOT DETECTED NOT DETECTED   Enterobacter cloacae complex NOT DETECTED NOT DETECTED   Escherichia coli NOT DETECTED NOT DETECTED   Klebsiella aerogenes NOT DETECTED NOT DETECTED   Klebsiella oxytoca NOT DETECTED NOT DETECTED   Klebsiella pneumoniae NOT DETECTED NOT DETECTED   Proteus species NOT DETECTED NOT DETECTED   Salmonella species NOT DETECTED NOT DETECTED   Serratia marcescens NOT DETECTED NOT DETECTED   Haemophilus influenzae NOT DETECTED NOT DETECTED   Neisseria meningitidis NOT DETECTED NOT DETECTED   Pseudomonas  aeruginosa NOT DETECTED NOT DETECTED   Stenotrophomonas maltophilia NOT DETECTED NOT DETECTED   Candida albicans NOT DETECTED NOT DETECTED   Candida auris NOT DETECTED NOT DETECTED   Candida glabrata NOT DETECTED NOT DETECTED   Candida krusei NOT DETECTED NOT DETECTED   Candida parapsilosis NOT DETECTED NOT DETECTED   Candida tropicalis NOT DETECTED NOT DETECTED   Cryptococcus neoformans/gattii NOT DETECTED NOT DETECTED   Vancomycin resistance DETECTED (A) NOT DETECTED    Tera Mater 12/18/2020  10:42 AM

## 2020-12-18 NOTE — Progress Notes (Signed)
VASCULAR LAB    Bilateral lower extremity venous duplex has been performed.  See CV proc for preliminary results.   Delsa Walder, RVT 12/18/2020, 1:54 PM

## 2020-12-18 NOTE — Progress Notes (Signed)
elink notified of patients increasing pressor support. 1 amp of bicarb given and vaso set dose increased to 0.04. Will monitor blood pressure with the non-invasive cuff, not a-line.

## 2020-12-18 NOTE — Plan of Care (Signed)
  Problem: Education: Goal: Knowledge of General Education information will improve Description: Including pain rating scale, medication(s)/side effects and non-pharmacologic comfort measures Outcome: Not Progressing   Problem: Health Behavior/Discharge Planning: Goal: Ability to manage health-related needs will improve Outcome: Not Progressing   Problem: Clinical Measurements: Goal: Ability to maintain clinical measurements within normal limits will improve Outcome: Not Progressing Goal: Will remain free from infection Outcome: Not Progressing Goal: Diagnostic test results will improve Outcome: Not Progressing Goal: Respiratory complications will improve Outcome: Not Progressing   Problem: Activity: Goal: Risk for activity intolerance will decrease Outcome: Not Progressing   Problem: Nutrition: Goal: Adequate nutrition will be maintained Outcome: Not Progressing   Problem: Elimination: Goal: Will not experience complications related to bowel motility Outcome: Not Progressing   Problem: Pain Managment: Goal: General experience of comfort will improve Outcome: Not Progressing

## 2020-12-18 NOTE — Progress Notes (Signed)
NAME:  Gaberial Cada, MRN:  [id number], DOB:  06-Dec-1950, LOS: 1 ADMISSION DATE:  01/06/21, CONSULTATION DATE:  2/4 REFERRING MD:  [facility], CHIEF COMPLAINT:  Renal failure, shock  Brief History:  70 yo M from [facility] following COVID19 PNA who has hypoxemic respiratory failure, ptx (s/p pigtail), renal failure, and shock (likely septic)   History of Present Illness:  70 yo M PMH COVID-19 PNA, HTN, HLD who has been at [facility] following COVID-19 PNA (postive date 1/14), transferred to MICU 2/4 with worsening renal failure, shock, and progressive hypoxemia on MV. Previously pt DNR. Notably, pt to ED 2/3 with ptx and R pigtail was placed at that time. 2/3 [facility] also d/w physician at [facility] concerns  About pt new pressor requirement, but pt taken back to [facility] per [facility] MD.  In review of labs, looks like 2/3 pt had new hyperkalemia and AKI with Cr 3.4 from 1.3.  On 2/4 the patient had worsening renal failure and worsening shock, which prompted transfer from [facility] to ICU.   Past Medical History:  COVID 19 PNA Afib Acute on chronic respiratory failure HTN HLD  Significant Hospital Events:  2/4 transferred to MICU from [facility]. Renal failure, worsening shock. Intubated, on high vent settings and SpO2 88% 2/5 worsening shock  Consults:  nephro  Procedures:  2/3 R pigtail (placed in ED) 2/4 CVC R IJ (placed at [facility])  2/4 HD cath   Significant Diagnostic Tests:  2/4 CXR >>  Micro Data:  Prior COVID +  2/4 BCx> 2/4 Tracheal aspirate>  Antimicrobials:  2/4 vanc> 2/4 zosyn>  Interim History / Subjective:  Worsening shock, sats low 80s, high 70s on ABGs.  Objective   Blood pressure (!) 86/29, pulse (!) 122, temperature (!) 97.5 F (36.4 C), temperature source Oral, resp. rate (!) 24, weight 129.1 kg, SpO2 90 %.    Vent Mode: PRVC FiO2 (%):  [100 %] 100 % Set Rate:  [30 bmp] 30 bmp Vt Set:  [580 mL] 580 mL PEEP:  [10 cmH20-12 cmH20] 10 cmH20 Plateau  Pressure:  [28 cmH20-36 cmH20] 28 cmH20   Intake/Output Summary (Last 24 hours) at 12/18/2020 1626 Last data filed at 12/18/2020 1600 Gross per 24 hour  Intake 6309.99 ml  Output 7193 ml  Net -883.01 ml   Filed Weights   Jan 06, 2021 1530 12/18/20 0421  Weight: 128.8 kg 129.1 kg    Examination: General: Critically ill appearing older adult M, intubated sedated NAD HENT: ETT secure. Bilateral nares packed and saturated with blood, improved from prior. Trachea midline  Lungs: R chest tube. Mechanically ventilated. Rhonchi Cardiovascular: rrr sluggish cap refill s1s2 Abdomen: round nd nt Extremities:  Neuro: Sedated. Not following commands.  Resolved Hospital Problem list     Assessment & Plan:   Acute respiratory failure with hypoxemia and hypercarbia requiring MV -pt recently reversed code status R PTX s/p chest tube placement 2/3 Recent COVID-19 PNA P -Full MV support -VAP, PAD -routine chest tube management - no air leak currently  Septic shock: VRE pneumonia and bacteremia.  Lactic acidosis P -zosyn, linezolid -stress dose steroids -Line placed 2/4 in setting of shock and after decompensation, clinically not source of bacteremia, will keep for now, unable to give line holiday given multiple drips keeoing him alive  Epistaxis, with ongoing bleeding -likely r/t trauma from NGT attempts + apixaban for Afib.  -ongoing bleeding likely worsened due to uremia P -rhino rockets  -Labs not consistent with DIC  Acute renal failure  with anuria and uremia  -seen by nephro at [facility] prior to transfer -renal US 2/4 without etiology for renal failure Hyperkalemia AGMA P -CRRT still with acidemia -continue bicarb gtt for now   Hx Afib -ICU monitoring -optimize electrolytes -not on hep gtt -- ongoing epistaxis,  Hypocalcemia Hypernatremia, mild P -replace Ca, cont to trend   Hyperglycemia P -SSI  Best practice (evaluated daily)  Diet: npo Pain/Anxiety/Delirium  protocol (if indicated): fent, PRN versed VAP protocol (if indicated): yes DVT prophylaxis: SCD GI prophylaxis: protonix  Glucose control: SSI Mobility: BR Disposition:ICU  Goals of Care:  Last date of multidisciplinary goals of care discussion: 2/4 family updated upon arrival to MICU. Pt reversed code status from DNR to Full at [facility], and was intubated in this setting. Had apparently also articulated "wanting everything" Family and staff present: Dr Katrinka Blazing PCCM Summary of discussion:  Full scope Follow up goals of care discussion due: 2/11 Code Status: [facility]   CBC: Recent Labs  Lab 12/13/20 1048 12/16/20 0516 12/16/20 0922 12/23/2020 0509 01/04/2021 1259 12/23/2020 1421 12/24/2020 2129 12/18/20 0005 12/18/20 0915  WBC 16.1* 19.2*  --  30.9* 32.1*  --   --  40.3*  --   HGB 16.3 18.4*   < > 13.9 12.9* 12.6* 13.6 14.0 15.0  HCT 50.3 62.2*   < > 44.1 40.0 37.0* 40.0 44.6 44.0  MCV 96.9 107.4*  --  104.8* 101.5*  --   --  103.0*  --   PLT 248 181  --  PLATELET CLUMPS NOTED ON SMEAR, COUNT APPEARS DECREASED 58*  56*  --   --  54*  --    < > = values in this interval not displayed.    Basic Metabolic Panel: Recent Labs  Lab 12/23/2020 0509 01/08/2021 1259 12/18/2020 1421 01/03/2021 1640 12/29/2020 2129 12/18/20 0005 12/18/20 0412 12/18/20 0915 12/18/20 1528  NA 147* 143   < > 144 142  --  139 138 136  K 5.6* 4.9   < > 4.9 4.3  --  4.7 4.8 5.5*  CL 103 100  --  100  --   --  98  --  98  CO2 21* 22  --  22  --   --  21*  --  19*  GLUCOSE 295* 277*  --  191*  --   --  148*  --  104*  BUN 159* 160*  --  162*  --   --  113*  --  76*  CREATININE 4.90* 5.45*  --  5.44*  --   --  3.92*  --  3.26*  CALCIUM 5.3* 5.2*  --  5.4*  --   --  6.3*  --  6.5*  MG  --  2.8*  --   --   --  2.7*  --   --   --   PHOS  --   --   --  11.5*  --   --  8.6*  --  8.2*   < > = values in this interval not displayed.   GFR: Estimated Creatinine Clearance: 28.9 mL/min (A) (by C-G formula based on SCr of  3.26 mg/dL (H)). Recent Labs  Lab 12/16/20 0516 12/16/20 1425 12/15/2020 0509 12/15/2020 1259 12/25/2020 1640 12/18/20 0005 12/18/20 1423  WBC 19.2*  --  30.9* 32.1*  --  40.3*  --   LATICACIDVEN  --    < > 4.9* 4.7* 4.1*  --  7.6*   < > =  values in this interval not displayed.    Liver Function Tests: Recent Labs  Lab 2020/12/28 1259 12/28/20 1640 12/18/20 0005 12/18/20 0412 12/18/20 1528  AST 3,276*  --  2,818*  --   --   ALT 2,332*  --  2,330*  --   --   ALKPHOS 98  --  111  --   --   BILITOT 3.1*  --  3.9*  --   --   PROT 4.9*  --  5.5*  --   --   ALBUMIN 1.5* 1.5* 1.8* 1.8* 1.8*   No results for input(s): LIPASE, AMYLASE in the last 168 hours. No results for input(s): AMMONIA in the last 168 hours.  ABG    Component Value Date/Time   PHART 7.235 (L) 12/18/2020 0915   PCO2ART 56.1 (H) 12/18/2020 0915   PO2ART 48 (L) 12/18/2020 0915   HCO3 24.2 12/18/2020 0915   TCO2 26 12/18/2020 0915   ACIDBASEDEF 5.0 (H) 12/18/2020 0915   O2SAT 79.0 12/18/2020 0915     Coagulation Profile: Recent Labs  Lab 12-28-2020 1259  INR 6.3*    Cardiac Enzymes: No results for input(s): CKTOTAL, CKMB, CKMBINDEX, TROPONINI in the last 168 hours.  HbA1C: Hgb A1c MFr Bld  Date/Time Value Ref Range Status  Dec 28, 2020 12:59 PM 6.8 (H) 4.8 - 5.6 % Final    Comment:    (NOTE) Pre diabetes:          5.7%-6.4%  Diabetes:              >6.4%  Glycemic control for   <7.0% adults with diabetes     CBG: Recent Labs  Lab 28-Dec-2020 2344 12/18/20 0416 12/18/20 0804 12/18/20 1133 12/18/20 1520  GLUCAP 160* 154* 118* 80 100*    Review of Systems:   Unable to obtain, intubated and sedated   Past Medical History:  He,  has a past medical history of Acute on chronic respiratory failure with hypoxia (HCC), Chronic atrial fibrillation (HCC), Community acquired pneumonia of both lower lobes, COVID-19 virus infection, and Pneumonia due to COVID-19 virus.   Surgical History:  Unavailable    Social History:      Family History:  His family history is not on file.   Allergies No Known Allergies   Home Medications  Prior to Admission medications   Not on File     Critical care time: 61 min      CRITICAL CARE Performed by: Lesia Sago Wisdom Rickey   Total critical care time: 41 minutes  Critical care time was exclusive of separately billable procedures and treating other patients. Critical care was necessary to treat or prevent imminent or life-threatening deterioration.  Critical care was time spent personally by me on the following activities: development of treatment plan with patient and/or surrogate as well as nursing, discussions with consultants, evaluation of patient's response to treatment, examination of patient, obtaining history from patient or surrogate, ordering and performing treatments and interventions, ordering and review of laboratory studies, ordering and review of radiographic studies, pulse oximetry and re-evaluation of patient's condition.  Karren Burly, MD

## 2020-12-19 DIAGNOSIS — L8990 Pressure ulcer of unspecified site, unspecified stage: Secondary | ICD-10-CM | POA: Diagnosis present

## 2020-12-19 DIAGNOSIS — J9601 Acute respiratory failure with hypoxia: Secondary | ICD-10-CM | POA: Diagnosis not present

## 2020-12-19 DIAGNOSIS — J9621 Acute and chronic respiratory failure with hypoxia: Secondary | ICD-10-CM | POA: Diagnosis not present

## 2020-12-19 DIAGNOSIS — Z2239 Carrier of other specified bacterial diseases: Secondary | ICD-10-CM

## 2020-12-19 DIAGNOSIS — A419 Sepsis, unspecified organism: Secondary | ICD-10-CM | POA: Diagnosis present

## 2020-12-19 DIAGNOSIS — K567 Ileus, unspecified: Secondary | ICD-10-CM

## 2020-12-19 DIAGNOSIS — N179 Acute kidney failure, unspecified: Secondary | ICD-10-CM | POA: Diagnosis not present

## 2020-12-19 DIAGNOSIS — N170 Acute kidney failure with tubular necrosis: Secondary | ICD-10-CM | POA: Diagnosis not present

## 2020-12-19 DIAGNOSIS — A4189 Other specified sepsis: Secondary | ICD-10-CM | POA: Diagnosis not present

## 2020-12-19 DIAGNOSIS — U071 COVID-19: Secondary | ICD-10-CM | POA: Diagnosis not present

## 2020-12-19 DIAGNOSIS — K7200 Acute and subacute hepatic failure without coma: Secondary | ICD-10-CM | POA: Diagnosis present

## 2020-12-19 DIAGNOSIS — S270XXA Traumatic pneumothorax, initial encounter: Secondary | ICD-10-CM | POA: Diagnosis present

## 2020-12-19 DIAGNOSIS — J9602 Acute respiratory failure with hypercapnia: Secondary | ICD-10-CM

## 2020-12-19 LAB — LACTIC ACID, PLASMA: Lactic Acid, Venous: >11 mmol/L (ref 0.5–1.9)

## 2020-12-19 LAB — RENAL FUNCTION PANEL
Albumin: 1.4 g/dL — ABNORMAL LOW (ref 3.5–5.0)
Anion gap: 22 — ABNORMAL HIGH (ref 5–15)
BUN: 51 mg/dL — ABNORMAL HIGH (ref 8–23)
CO2: 16 mmol/L — ABNORMAL LOW (ref 22–32)
Calcium: 6.7 mg/dL — ABNORMAL LOW (ref 8.9–10.3)
Chloride: 99 mmol/L (ref 98–111)
Creatinine, Ser: 2.72 mg/dL — ABNORMAL HIGH (ref 0.61–1.24)
GFR, Estimated: 25 mL/min — ABNORMAL LOW (ref >60)
Glucose, Bld: 85 mg/dL (ref 70–99)
Phosphorus: 8 mg/dL — ABNORMAL HIGH (ref 2.5–4.6)
Potassium: 4.8 mmol/L (ref 3.5–5.1)
Sodium: 137 mmol/L (ref 135–145)

## 2020-12-19 LAB — HEPATIC FUNCTION PANEL
ALT: 4065 U/L — ABNORMAL HIGH (ref 0–44)
AST: 5141 U/L — ABNORMAL HIGH (ref 15–41)
Albumin: 1.4 g/dL — ABNORMAL LOW (ref 3.5–5.0)
Alkaline Phosphatase: 163 U/L — ABNORMAL HIGH (ref 38–126)
Bilirubin, Direct: 3.3 mg/dL — ABNORMAL HIGH (ref 0.0–0.2)
Indirect Bilirubin: 1.5 mg/dL — ABNORMAL HIGH (ref 0.3–0.9)
Total Bilirubin: 4.8 mg/dL — ABNORMAL HIGH (ref 0.3–1.2)
Total Protein: 4.8 g/dL — ABNORMAL LOW (ref 6.5–8.1)

## 2020-12-19 LAB — CBC
HCT: 43.8 % (ref 39.0–52.0)
Hemoglobin: 12.8 g/dL — ABNORMAL LOW (ref 13.0–17.0)
MCH: 31.5 pg (ref 26.0–34.0)
MCHC: 29.2 g/dL — ABNORMAL LOW (ref 30.0–36.0)
MCV: 107.9 fL — ABNORMAL HIGH (ref 80.0–100.0)
Platelets: 48 10*3/uL — ABNORMAL LOW (ref 150–400)
RBC: 4.06 MIL/uL — ABNORMAL LOW (ref 4.22–5.81)
RDW: 15.5 % (ref 11.5–15.5)
WBC: 40 10*3/uL — ABNORMAL HIGH (ref 4.0–10.5)
nRBC: 1.1 % — ABNORMAL HIGH (ref 0.0–0.2)

## 2020-12-19 LAB — POCT I-STAT 7, (LYTES, BLD GAS, ICA,H+H)
Acid-base deficit: 12 mmol/L — ABNORMAL HIGH (ref 0.0–2.0)
Bicarbonate: 18.6 mmol/L — ABNORMAL LOW (ref 20.0–28.0)
Calcium, Ion: 0.84 mmol/L — CL (ref 1.15–1.40)
HCT: 37 % — ABNORMAL LOW (ref 39.0–52.0)
Hemoglobin: 12.6 g/dL — ABNORMAL LOW (ref 13.0–17.0)
O2 Saturation: 89 %
Patient temperature: 94.1
Potassium: 4.3 mmol/L (ref 3.5–5.1)
Sodium: 135 mmol/L (ref 135–145)
TCO2: 20 mmol/L — ABNORMAL LOW (ref 22–32)
pCO2 arterial: 55.3 mmHg — ABNORMAL HIGH (ref 32.0–48.0)
pH, Arterial: 7.119 — CL (ref 7.350–7.450)
pO2, Arterial: 66 mmHg — ABNORMAL LOW (ref 83.0–108.0)

## 2020-12-19 LAB — GLUCOSE, CAPILLARY
Glucose-Capillary: 86 mg/dL (ref 70–99)
Glucose-Capillary: 211 mg/dL — ABNORMAL HIGH (ref 70–99)
Glucose-Capillary: 150 mg/dL — ABNORMAL HIGH (ref 70–99)
Glucose-Capillary: 173 mg/dL — ABNORMAL HIGH (ref 70–99)
Glucose-Capillary: 134 mg/dL — ABNORMAL HIGH (ref 70–99)

## 2020-12-19 LAB — MAGNESIUM: Magnesium: 2.6 mg/dL — ABNORMAL HIGH (ref 1.7–2.4)

## 2020-12-19 LAB — CALCIUM, IONIZED
Calcium, Ionized, Serum: 3.1 mg/dL — ABNORMAL LOW (ref 4.5–5.6)
Calcium, Ionized, Serum: 3.2 mg/dL — ABNORMAL LOW (ref 4.5–5.6)

## 2020-12-19 MED ORDER — SODIUM BICARBONATE 8.4 % IV SOLN
INTRAVENOUS | Status: AC
  Filled 2020-12-19: qty 50

## 2020-12-19 MED ORDER — LORAZEPAM 2 MG/ML IJ SOLN
2.0000 mg | INTRAMUSCULAR | Status: DC | PRN
  Administered 2020-12-19: 4 mg via INTRAVENOUS
  Filled 2020-12-19: qty 2

## 2020-12-19 MED ORDER — POLYVINYL ALCOHOL 1.4 % OP SOLN
1.0000 [drp] | Freq: Four times a day (QID) | OPHTHALMIC | Status: DC | PRN
  Filled 2020-12-19: qty 15

## 2020-12-19 MED ORDER — GLYCOPYRROLATE 0.2 MG/ML IJ SOLN: 0.2000 mg | INTRAMUSCULAR | Status: DC | PRN

## 2020-12-19 MED ORDER — HALOPERIDOL LACTATE 5 MG/ML IJ SOLN: 2.5000 mg | INTRAMUSCULAR | Status: DC | PRN

## 2020-12-19 MED ORDER — DEXTROSE 50 % IV SOLN
INTRAVENOUS | Status: AC
  Administered 2020-12-19: 50 mL
  Filled 2020-12-19: qty 50

## 2020-12-19 MED ORDER — DEXTROSE 50 % IV SOLN
INTRAVENOUS | Status: AC
  Filled 2020-12-19: qty 50

## 2020-12-19 MED ORDER — GLYCOPYRROLATE 1 MG PO TABS: 1.0000 mg | ORAL_TABLET | ORAL | Status: DC | PRN

## 2020-12-20 LAB — CULTURE, RESPIRATORY W GRAM STAIN: Gram Stain: NONE SEEN

## 2020-12-20 LAB — CULTURE, BLOOD (ROUTINE X 2): Special Requests: ADEQUATE

## 2020-12-20 MED FILL — Sodium Chloride IV Soln 0.9%: INTRAVENOUS | Qty: 250 | Status: AC

## 2020-12-20 MED FILL — Phenylephrine HCl IV Soln 10 MG/ML: INTRAVENOUS | Qty: 100 | Status: AC

## 2020-12-22 LAB — CULTURE, BLOOD (ROUTINE X 2)
Culture: NO GROWTH
Special Requests: ADEQUATE

## 2020-12-23 LAB — CULTURE, BLOOD (ROUTINE X 2): Culture: NO GROWTH

## 2021-01-11 NOTE — Significant Event (Signed)
Worsening hypotension, refractory mixed respiratory/metabolic acidemia despite CRRT. Worsening shock liver. Not clearing lactate. Pupils more dilated, sluggish. Patietn actively dying on full support. Called wife and updated. Recommended comfort measures. She said she doesn't want him to suffer. Family was on speaker and discussed. Agreed to move forward with comfort care. Their greatest desire is for him to not struggle breathing anymore. Ensured them I can help achieve a peaceful and dignified death. I told them he would pass very quickly within minutes once support removed. Will titrate medication for comfort. Once achieved, will stop CRRT, extubate, and turn off vasoactive infusions.

## 2021-01-11 NOTE — Progress Notes (Signed)
RT note-Patient extubated at 1413 to comfort care per family wishes and MD order.

## 2021-01-11 NOTE — Progress Notes (Signed)
[facility] notified of ABG results. No new orders.

## 2021-01-11 NOTE — Progress Notes (Addendum)
Patient ID: Matthew Oliver, male   DOB: 07/04/1951, 70 y.o.   MRN: [id number] [facility] Progress Note   Assessment/ Plan:   1.  Acute kidney injury on chronic kidney disease stage III: Likely ATN in the setting of sepsis/septic shock.  Started on 12/27/2020 on CRRT for anuric acute kidney injury for the management of multiple metabolic abnormalities and volume status.  Remains anuric and without any evidence of renal recovery-we will continue CRRT at the current prescription and keep even from a fluid standpoint as he is not able to tolerate ultrafiltration. 2.  Hyperkalemia: Secondary to acute kidney injury, this has been corrected with CRRT. 3.  Anion gap metabolic acidosis: Secondary to acute kidney injury and now corrected with CRRT. 4.  Acute respiratory failure with hypoxia/hypercarbia: With right pneumothorax status post chest tube placement on 2/3; ventilator management per CCM service. 5.  Shock: Suspected to be septic shock with cultures obtained and initiation of broad-spectrum antimicrobial therapy with vancomycin and Zosyn.  Evidence of shock liver noted with significantly elevated transaminases.  Subjective:   With hemodynamic instability and increased pressor requirements overnight-now DNR.   Objective:   BP 120/82   Pulse (!) 126   Temp (!) 94.1 F (34.5 C) (Axillary)   Resp (!) 30   Wt 129.9 kg   SpO2 93%   BMI 41.09 kg/m   Intake/Output Summary (Last 24 hours) at 2021-01-05 0732 Last data filed at 2021-01-05 0700 Gross per 24 hour  Intake 6858.65 ml  Output 5421 ml  Net 1437.65 ml   Weight change: 1.1 kg  Physical Exam: Gen: Intubated, sedated CVS: Pulse regular tachycardia, S1 and S2 normal Resp: Anteriorly clear to auscultation, no rales/rhonchi Abd: Soft, obese, nontender, bowel sounds normal Ext: Trace-1+ lower extremity and dependent edema.  Right femoral dialysis catheter.  Imaging: DG Abd 1 View  Result Date: 12/14/2020 CLINICAL DATA:   70 year old male with with NG. EXAM: ABDOMEN - 1 VIEW COMPARISON:  Abdominal radiograph dated 12/16/2020. FINDINGS: Enteric tube with tip in the distal stomach. Right sided chest tube. Bilateral confluent and streaky airspace opacities noted. A central venous line with tip at the cavoatrial junction. Right femoral line noted. No bowel dilatation or evidence of obstruction. The osseous structures and soft tissues are grossly unremarkable. IMPRESSION: Enteric tube with tip in the distal stomach. Electronically Signed   By: Anner Crete M.D.   On: 12/22/2020 18:29   DG Chest Port 1 View  Result Date: 12/18/2020 CLINICAL DATA:  Congestive heart failure. EXAM: PORTABLE CHEST 1 VIEW COMPARISON:  December 17, 2020. FINDINGS: The heart size and mediastinal contours are within normal limits. Endotracheal and nasogastric tubes are unchanged in position. Right internal jugular catheter is unchanged. Stable position of right-sided chest tube. No pneumothorax is noted. Stable bilateral lung opacities are noted concerning for multifocal pneumonia. The visualized skeletal structures are unremarkable. IMPRESSION: Stable support apparatus. Stable bilateral lung opacities are noted concerning for multifocal pneumonia. Electronically Signed   By: Marijo Conception M.D.   On: 12/18/2020 08:32   DG CHEST PORT 1 VIEW  Result Date: 12/24/2020 CLINICAL DATA:  Central line placement. EXAM: PORTABLE CHEST 1 VIEW COMPARISON:  Earlier same day FINDINGS: 1259 hours. Endotracheal tube tip is approximately 4.8 cm above the base of the carina. The NG tube passes into the stomach although the distal tip position is not included on the film. Right IJ central line is new in the interval with tip overlying the distal SVC level. Right  pleural drain again noted. Diffuse bilateral airspace disease is similar to prior. Telemetry leads overlie the chest. IMPRESSION: 1. New right IJ central line tip overlies the distal SVC level near the junction  with the RA. No pneumothorax or pleural effusion. 2. Otherwise no substantial interval change in exam. Diffuse bilateral airspace disease. Electronically Signed   By: Misty Stanley M.D.   On: 12/16/2020 13:08   ECHOCARDIOGRAM COMPLETE  Result Date: 12/24/2020    ECHOCARDIOGRAM REPORT   Patient Name:   Matthew Oliver Date of Exam: 01/10/2021 Medical Rec #:  [id number]      Height:       70.0 in Accession #:    [id number]     Weight:       264.6 lb Date of Birth:  02/26/1951       BSA:          2.351 m Patient Age:    70 years       BP:           146/63 mmHg Patient Gender: M              HR:           97 bpm. Exam Location:  Inpatient Procedure: 2D Echo, Color Doppler and Cardiac Doppler Indications:    Shock ([place]) [[id number]]  History:        Patient has no prior history of Echocardiogram examinations.                 CHF, Arrythmias:Atrial Fibrillation; Risk Factors:Hypertension                 and Dyslipidemia. [facility] suffering                 acute respiratory failure [id number] pneumonia complicated by                 development of right-sided pneumothorax status post chest tube                 placement.  Sonographer:    Darlina Sicilian RDCS Referring Phys: [id number] GRACE E BOWSER  Sonographer Comments: Technically challenging study due to limited acoustic windows. IMPRESSIONS  1. Left ventricular ejection fraction, by estimation, is 65 to 70%. The left ventricle has normal function. The left ventricle has no regional wall motion abnormalities. There is mild left ventricular hypertrophy. Left ventricular diastolic parameters are indeterminate.  2. Right ventricular systolic function is normal. The right ventricular size is normal.  3. The pericardial effusion is circumferential.  4. The mitral valve is normal in structure. No evidence of mitral valve regurgitation. No evidence of mitral stenosis.  5. The aortic valve is normal in structure. Aortic valve regurgitation is not visualized. Mild  aortic valve sclerosis is present, with no evidence of aortic valve stenosis.  6. The inferior vena cava is dilated in size with >50% respiratory variability, suggesting right atrial pressure of 8 mmHg. FINDINGS  Left Ventricle: Left ventricular ejection fraction, by estimation, is 65 to 70%. The left ventricle has normal function. The left ventricle has no regional wall motion abnormalities. The left ventricular internal cavity size was normal in size. There is  mild left ventricular hypertrophy. Left ventricular diastolic parameters are indeterminate. Right Ventricle: The right ventricular size is normal. No increase in right ventricular wall thickness. Right ventricular systolic function is normal. Left Atrium: Left atrial size was normal in size. Right Atrium: Right atrial size was normal in  size. Pericardium: Trivial pericardial effusion is present. The pericardial effusion is circumferential. Mitral Valve: The mitral valve is normal in structure. There is moderate thickening of the mitral valve leaflet(s). No evidence of mitral valve regurgitation. No evidence of mitral valve stenosis. Tricuspid Valve: The tricuspid valve is normal in structure. Tricuspid valve regurgitation is trivial. No evidence of tricuspid stenosis. Aortic Valve: The aortic valve is normal in structure. Aortic valve regurgitation is not visualized. Mild aortic valve sclerosis is present, with no evidence of aortic valve stenosis. Pulmonic Valve: The pulmonic valve was normal in structure. Pulmonic valve regurgitation is not visualized. No evidence of pulmonic stenosis. Aorta: The aortic root is normal in size and structure. Venous: The inferior vena cava is dilated in size with greater than 50% respiratory variability, suggesting right atrial pressure of 8 mmHg. IAS/Shunts: No atrial level shunt detected by color flow Doppler.  LEFT VENTRICLE PLAX 2D LVIDd:         3.80 cm LVIDs:         2.40 cm LV PW:         1.30 cm LV IVS:        1.20  cm LVOT diam:     1.90 cm LVOT Area:     2.84 cm   AORTA Ao Root diam: 3.40 cm  SHUNTS Systemic Diam: 1.90 cm Candee Furbish MD Electronically signed by Candee Furbish MD Signature Date/Time: 12/23/2020/3:39:59 PM    Final    US LIVER DOPPLER  Result Date: 01/07/2021 CLINICAL DATA:  Abnormal liver function tests, respiratory failure secondary to COVID-19 pneumonia and renal failure. EXAM: DUPLEX ULTRASOUND OF LIVER TECHNIQUE: Color and duplex Doppler ultrasound was performed to evaluate the hepatic in-flow and out-flow vessels. COMPARISON:  None. FINDINGS: Portal Vein Velocities Main:  9-14 cm/sec Right:  31 cm/sec Left:  27 cm/sec Hepatic Vein Velocities Right:  20 cm/sec Middle:  19 cm/sec Left:  21 cm/sec Hepatic Artery Velocity:  343 cm/sec Varices: None visualized. Ascites: None visualized. There is flow in the portal vein but flow appears attenuated and of low velocity, suggestive of portal hypertension. No definite portal vein thrombus identified. No evidence of hepatic veno-occlusive disease. The intrahepatic IVC demonstrates normal patency. IMPRESSION: Low main portal vein velocities without definite portal vein thrombus. Findings are suggestive of portal hypertension. Electronically Signed   By: Aletta Edouard M.D.   On: 12/31/2020 17:49   VAS [place] LOWER EXTREMITY VENOUS (DVT)  Result Date: 12/18/2020  Lower Venous DVT Study Indications: Edema, and recent Covid-19 infection.  Limitations: Bair hugger, ventilation, line and bandages. Comparison Study: No prior study Performing Technologist: Sharion Dove RVS  Examination Guidelines: A complete evaluation includes B-mode imaging, spectral Doppler, color Doppler, and power Doppler as needed of all accessible portions of each vessel. Bilateral testing is considered an integral part of a complete examination. Limited examinations for reoccurring indications may be performed as noted. The reflux portion of the exam is performed with the patient in reverse  Trendelenburg.  +---------+---------------+---------+-----------+----------+-------------------+ RIGHT    CompressibilityPhasicitySpontaneityPropertiesThrombus Aging      +---------+---------------+---------+-----------+----------+-------------------+ CFV                                                   Not well visualized +---------+---------------+---------+-----------+----------+-------------------+ SFJ  Not well visualized +---------+---------------+---------+-----------+----------+-------------------+ FV Prox  Full           Yes      Yes                                      +---------+---------------+---------+-----------+----------+-------------------+ FV Mid   Full                                                             +---------+---------------+---------+-----------+----------+-------------------+ FV DistalFull                                                             +---------+---------------+---------+-----------+----------+-------------------+ PFV      Full                                                             +---------+---------------+---------+-----------+----------+-------------------+ POP      Full           Yes      Yes                                      +---------+---------------+---------+-----------+----------+-------------------+ PTV                                                   Not well visualized +---------+---------------+---------+-----------+----------+-------------------+ PERO                                                  Not well visualized +---------+---------------+---------+-----------+----------+-------------------+   +---------+---------------+---------+-----------+----------+-------------------+ LEFT     CompressibilityPhasicitySpontaneityPropertiesThrombus Aging       +---------+---------------+---------+-----------+----------+-------------------+ CFV      Full           Yes      Yes                                      +---------+---------------+---------+-----------+----------+-------------------+ SFJ      Full                                                             +---------+---------------+---------+-----------+----------+-------------------+ FV Prox  Full                                                             +---------+---------------+---------+-----------+----------+-------------------+  FV Mid   Full                                                             +---------+---------------+---------+-----------+----------+-------------------+ FV DistalFull                                                             +---------+---------------+---------+-----------+----------+-------------------+ PFV      Full                                                             +---------+---------------+---------+-----------+----------+-------------------+ POP      Full           Yes      Yes                                      +---------+---------------+---------+-----------+----------+-------------------+ PTV                                                   Not well visualized +---------+---------------+---------+-----------+----------+-------------------+ PERO                                                  Not well visualized +---------+---------------+---------+-----------+----------+-------------------+     Summary: RIGHT: - There is no evidence of deep vein thrombosis in the lower extremity. However, portions of this examination were limited- see technologist comments above.  LEFT: - There is no evidence of deep vein thrombosis in the lower extremity. However, portions of this examination were limited- see technologist comments above.  *See table(s) above for measurements and observations. Electronically  signed by Monica Martinez MD on 12/18/2020 at 3:46:54 PM.    Final     Labs: BMET Recent Labs  Lab 12/21/2020 5409 12/24/2020 1259 12/22/2020 1421 12/24/2020 1640 01/09/2021 2129 12/18/20 8119 12/18/20 0915 12/18/20 1528 12/18/20 2112 12/31/2020 0310 December 31, 2020 0331  NA 147* 143   < > 144 142 139 138 136 138 137 135  K 5.6* 4.9   < > 4.9 4.3 4.7 4.8 5.5* 4.5 4.8 4.3  CL 103 100  --  100  --  98  --  98 99 99  --   CO2 21* 22  --  22  --  21*  --  19* 17* 16*  --   GLUCOSE 295* 277*  --  191*  --  148*  --  104* 129* 85  --   BUN 159* 160*  --  162*  --  113*  --  76* 65* 51*  --   CREATININE 4.90* 5.45*  --  5.44*  --  3.92*  --  3.26* 3.14* 2.72*  --   CALCIUM 5.3* 5.2*  --  5.4*  --  6.3*  --  6.5* 6.6* 6.7*  --   PHOS  --   --   --  11.5*  --  8.6*  --  8.2*  --  8.0*  --    < > = values in this interval not displayed.   CBC Recent Labs  Lab 12/16/2020 0509 12/24/2020 1259 01/01/2021 1421 12/18/20 0005 12/18/20 0915 01-09-2021 0251 01-09-2021 0331  WBC 30.9* 32.1*  --  40.3*  --  40.0*  --   HGB 13.9 12.9*   < > 14.0 15.0 12.8* 12.6*  HCT 44.1 40.0   < > 44.6 44.0 43.8 37.0*  MCV 104.8* 101.5*  --  103.0*  --  107.9*  --   PLT PLATELET CLUMPS NOTED ON SMEAR, COUNT APPEARS DECREASED 58*  56*  --  54*  --  48*  --    < > = values in this interval not displayed.    Medications:    . artificial tears  1 application Both Eyes V4C  . calcium carbonate (dosed in mg elemental calcium)  500 mg of elemental calcium Per Tube TID  . chlorhexidine gluconate (MEDLINE KIT)  15 mL Mouth Rinse BID  . Chlorhexidine Gluconate Cloth  6 each Topical Daily  . dextrose      . docusate  100 mg Per Tube BID  . feeding supplement (PROSource TF)  90 mL Per Tube TID  . fentaNYL (SUBLIMAZE) injection  25 mcg Intravenous Once  . hydrocortisone sod succinate (SOLU-CORTEF) inj  50 mg Intravenous Q6H  . insulin aspart  0-15 Units Subcutaneous Q4H  . mouth rinse  15 mL Mouth Rinse 10 times per day  .  pantoprazole (PROTONIX) IV  40 mg Intravenous QHS  . polyethylene glycol  17 g Per Tube Daily   Elmarie Shiley, MD 2021-01-09, 7:32 AM

## 2021-01-11 NOTE — Progress Notes (Addendum)
Patient's bilateral pupils noted to be oval, 5 mm, nonreactive. MD notified, instructed this RN to monitor for now as patient is unable to be taken to CT or MRI. MD to call family with update.   Temp reading 92 degrees axillary despite Bair Hugger. MD notified. MD notified of Lactic >11 @ 11:20 am 2021-01-18.   MD instructed this RN to do no turns and skip bath today d/t hemodynamic instability.

## 2021-01-11 NOTE — Progress Notes (Signed)
75 mL of remaining Fentanyl drip (10 mcg/mL) wasted with Santa Lighter, RN.

## 2021-01-11 NOTE — Progress Notes (Addendum)
INFECTIOUS DISEASE PROGRESS NOTE  ID: Matthew Oliver is a 70 y.o. male with  Active Problems:   Acute renal failure (ARF) ([place])   Pressure injury of skin  Subjective: No response  Abtx:  Anti-infectives (From admission, onward)   Start     Dose/Rate Route Frequency Ordered Stop   12/18/20 1600  vancomycin (VANCOREADY) IVPB 1500 mg/300 mL  Status:  Discontinued        1,500 mg 150 mL/hr over 120 Minutes Intravenous Every 24 hours 12/15/2020 1553 12/18/20 1040   12/18/20 1130  linezolid (ZYVOX) IVPB 600 mg        600 mg 300 mL/hr over 60 Minutes Intravenous Every 12 hours 12/18/20 1040     12/31/2020 1800  piperacillin-tazobactam (ZOSYN) IVPB 3.375 g        3.375 g 100 mL/hr over 30 Minutes Intravenous Every 6 hours 01/01/2021 1553     01/04/2021 1345  piperacillin-tazobactam (ZOSYN) IVPB 2.25 g  Status:  Discontinued        2.25 g 100 mL/hr over 30 Minutes Intravenous Every 8 hours 01/05/2021 1252 12/16/2020 1553   01/07/2021 1345  vancomycin (VANCOCIN) 2,500 mg in sodium chloride 0.9 % 500 mL IVPB        2,500 mg 250 mL/hr over 120 Minutes Intravenous  Once 12/20/2020 1252 12/18/20 0956   12/21/2020 1251  vancomycin variable dose per unstable renal function (pharmacist dosing)  Status:  Discontinued         Does not apply See admin instructions 12/27/2020 1252 12/27/2020 1600      Medications:  Scheduled: . artificial tears  1 application Both Eyes H8E  . calcium carbonate (dosed in mg elemental calcium)  500 mg of elemental calcium Per Tube TID  . chlorhexidine gluconate (MEDLINE KIT)  15 mL Mouth Rinse BID  . Chlorhexidine Gluconate Cloth  6 each Topical Daily  . dextrose      . docusate  100 mg Per Tube BID  . feeding supplement (PROSource TF)  90 mL Per Tube TID  . fentaNYL (SUBLIMAZE) injection  25 mcg Intravenous Once  . hydrocortisone sod succinate (SOLU-CORTEF) inj  50 mg Intravenous Q6H  . insulin aspart  0-15 Units Subcutaneous Q4H  . mouth rinse  15 mL Mouth Rinse 10 times per  day  . pantoprazole (PROTONIX) IV  40 mg Intravenous QHS  . polyethylene glycol  17 g Per Tube Daily    Objective: Vital signs in last 24 hours: Temp:  [94.1 F (34.5 C)-97.5 F (36.4 C)] 94.1 F (34.5 C) (02/06 0256) Pulse Rate:  [88-228] 166 (02/06 0815) Resp:  [20-31] 28 (02/06 0915) BP: (80-120)/(29-100) 97/84 (02/06 0912) SpO2:  [74 %-96 %] 78 % (02/06 0815) Arterial Line BP: (77-137)/(37-67) 102/52 (02/06 0915) FiO2 (%):  [100 %] 100 % (02/06 0800) Weight:  [129.9 kg] 129.9 kg (02/06 0257)   General appearance: no distress Resp: diminished breath sounds anterior - bilateral Cardio: regular rate and rhythm GI: normal findings: soft, non-tender and abnormal findings:  hypoactive bowel sounds  Lab Results Recent Labs    12/18/20 0005 12/18/20 0412 12/18/20 2112 06-Jan-2021 0251 06-Jan-2021 0310 01/06/2021 0331  WBC 40.3*  --   --  40.0*  --   --   HGB 14.0   < >  --  12.8*  --  12.6*  HCT 44.6   < >  --  43.8  --  37.0*  NA  --    < > 138  --  137 135  K  --    < > 4.5  --  4.8 4.3  CL  --    < > 99  --  99  --   CO2  --    < > 17*  --  16*  --   BUN  --    < > 65*  --  51*  --   CREATININE  --    < > 3.14*  --  2.72*  --    < > = values in this interval not displayed.   Liver Panel Recent Labs    12/18/20 0005 12/18/20 0412 2020-12-21 0251 12-21-2020 0310  PROT 5.5*  --  4.8*  --   ALBUMIN 1.8*   < > 1.4* 1.4*  AST 2,818*  --  5,141*  --   ALT 2,330*  --  4,065*  --   ALKPHOS 111  --  163*  --   BILITOT 3.9*  --  4.8*  --   BILIDIR 2.2*  --  3.3*  --   IBILI 1.7*  --  1.5*  --    < > = values in this interval not displayed.   Sedimentation Rate No results for input(s): ESRSEDRATE in the last 72 hours. C-Reactive Protein No results for input(s): CRP in the last 72 hours.  Microbiology: Recent Results (from the past 240 hour(s))  Culture, blood (routine x 2)     Status: None   Collection Time: 12/11/20  2:45 PM   Specimen: BLOOD LEFT HAND  Result Value  Ref Range Status   Specimen Description BLOOD LEFT HAND  Final   Special Requests   Final    BOTTLES DRAWN AEROBIC ONLY Blood Culture adequate volume   Culture   Final    NO GROWTH 5 DAYS Performed at [facility], [address], [place], [place] [address]    Report Status 12/16/2020 FINAL  Final  Culture, blood (routine x 2)     Status: None   Collection Time: 12/11/20  2:59 PM   Specimen: BLOOD  Result Value Ref Range Status   Specimen Description BLOOD LEFT ANTECUBITAL  Final   Special Requests   Final    BOTTLES DRAWN AEROBIC ONLY Blood Culture adequate volume   Culture   Final    NO GROWTH 5 DAYS Performed at [facility], [place] [address] [place], [place] [address]    Report Status 12/16/2020 FINAL  Final  Culture, blood (routine x 2)     Status: Abnormal (Preliminary result)   Collection Time: 01/10/2021  1:08 PM   Specimen: BLOOD  Result Value Ref Range Status   Specimen Description BLOOD FEMORAL ARTERY  Final   Special Requests   Final    BOTTLES DRAWN AEROBIC AND ANAEROBIC Blood Culture adequate volume   Culture  Setup Time   Final    GRAM POSITIVE COCCI IN PAIRS IN CHAINS AEROBIC BOTTLE ONLY CRITICAL RESULT CALLED TO, READ BACK BY AND VERIFIED WITH: CATHY PIERCE PHARMD '@1035'  12/18/20 EB    Culture (A)  Final    ENTEROCOCCUS FAECIUM SUSCEPTIBILITIES TO FOLLOW Performed at [facility], [place] [address] [place], [place] [address]    Report Status PENDING  Incomplete  Blood Culture ID Panel (Reflexed)     Status: Abnormal   Collection Time: 12/28/2020  1:08 PM  Result Value Ref Range Status   Enterococcus faecalis NOT DETECTED NOT DETECTED Final   Enterococcus Faecium DETECTED (A)  NOT DETECTED Final    Comment: CRITICAL RESULT CALLED TO, READ BACK BY AND VERIFIED WITH: CATHY PIERCE PHARMD '@1035'  12/18/20 EB    Listeria monocytogenes NOT DETECTED NOT DETECTED Final   Staphylococcus species NOT DETECTED NOT DETECTED Final   Staphylococcus aureus (BCID)  NOT DETECTED NOT DETECTED Final   Staphylococcus epidermidis NOT DETECTED NOT DETECTED Final   Staphylococcus lugdunensis NOT DETECTED NOT DETECTED Final   Streptococcus species NOT DETECTED NOT DETECTED Final   Streptococcus agalactiae NOT DETECTED NOT DETECTED Final   Streptococcus pneumoniae NOT DETECTED NOT DETECTED Final   Streptococcus pyogenes NOT DETECTED NOT DETECTED Final   A.calcoaceticus-baumannii NOT DETECTED NOT DETECTED Final   Bacteroides fragilis NOT DETECTED NOT DETECTED Final   Enterobacterales NOT DETECTED NOT DETECTED Final   Enterobacter cloacae complex NOT DETECTED NOT DETECTED Final   Escherichia coli NOT DETECTED NOT DETECTED Final   Klebsiella aerogenes NOT DETECTED NOT DETECTED Final   Klebsiella oxytoca NOT DETECTED NOT DETECTED Final   Klebsiella pneumoniae NOT DETECTED NOT DETECTED Final   Proteus species NOT DETECTED NOT DETECTED Final   Salmonella species NOT DETECTED NOT DETECTED Final   Serratia marcescens NOT DETECTED NOT DETECTED Final   Haemophilus influenzae NOT DETECTED NOT DETECTED Final   Neisseria meningitidis NOT DETECTED NOT DETECTED Final   Pseudomonas aeruginosa NOT DETECTED NOT DETECTED Final   Stenotrophomonas maltophilia NOT DETECTED NOT DETECTED Final   Candida albicans NOT DETECTED NOT DETECTED Final   Candida auris NOT DETECTED NOT DETECTED Final   Candida glabrata NOT DETECTED NOT DETECTED Final   Candida krusei NOT DETECTED NOT DETECTED Final   Candida parapsilosis NOT DETECTED NOT DETECTED Final   Candida tropicalis NOT DETECTED NOT DETECTED Final   Cryptococcus neoformans/gattii NOT DETECTED NOT DETECTED Final   Vancomycin resistance DETECTED (A) NOT DETECTED Final    Comment: CRITICAL RESULT CALLED TO, READ BACK BY AND VERIFIED WITH: CATHY PIERCE PHARMD '@1035'  12/18/20 EB Performed at [facility], [address], [place], [place] [address]   Culture, blood (routine x 2)     Status: None (Preliminary result)   Collection  Time: 12/18/2020  1:40 PM   Specimen: BLOOD LEFT FOREARM  Result Value Ref Range Status   Specimen Description BLOOD LEFT FOREARM  Final   Special Requests   Final    BOTTLES DRAWN AEROBIC AND ANAEROBIC Blood Culture adequate volume   Culture   Final    NO GROWTH 1 DAY Performed at [facility], [place] [address] [place], [place] [address]    Report Status PENDING  Incomplete  Culture, respiratory (tracheal aspirate)     Status: None (Preliminary result)   Collection Time: 12/26/2020  8:54 PM   Specimen: Tracheal Aspirate; Respiratory  Result Value Ref Range Status   Specimen Description TRACHEAL ASPIRATE  Final   Special Requests NONE  Final   Gram Stain   Final    NO WBC SEEN RARE GRAM POSITIVE COCCI Performed at [facility], [place] [address], [place], [place] [address]    Culture PENDING  Incomplete   Report Status PENDING  Incomplete    Studies/Results: DG Abd 1 View  Result Date: 01/03/2021 CLINICAL DATA:  70 year old male with with NG. EXAM: ABDOMEN - 1 VIEW COMPARISON:  Abdominal radiograph dated 12/16/2020. FINDINGS: Enteric tube with tip in the distal stomach. Right sided chest tube. Bilateral confluent and streaky airspace opacities noted. A central venous line with tip at the cavoatrial junction. Right femoral line  noted. No bowel dilatation or evidence of obstruction. The osseous structures and soft tissues are grossly unremarkable. IMPRESSION: Enteric tube with tip in the distal stomach. Electronically Signed   By: Anner Crete M.D.   On: 01/02/2021 18:29   DG Chest Port 1 View  Result Date: 12/18/2020 CLINICAL DATA:  Congestive heart failure. EXAM: PORTABLE CHEST 1 VIEW COMPARISON:  December 17, 2020. FINDINGS: The heart size and mediastinal contours are within normal limits. Endotracheal and nasogastric tubes are unchanged in position. Right internal jugular catheter is unchanged. Stable position of right-sided chest tube. No pneumothorax is noted. Stable  bilateral lung opacities are noted concerning for multifocal pneumonia. The visualized skeletal structures are unremarkable. IMPRESSION: Stable support apparatus. Stable bilateral lung opacities are noted concerning for multifocal pneumonia. Electronically Signed   By: Marijo Conception M.D.   On: 12/18/2020 08:32   DG CHEST PORT 1 VIEW  Result Date: 01/03/2021 CLINICAL DATA:  Central line placement. EXAM: PORTABLE CHEST 1 VIEW COMPARISON:  Earlier same day FINDINGS: 1259 hours. Endotracheal tube tip is approximately 4.8 cm above the base of the carina. The NG tube passes into the stomach although the distal tip position is not included on the film. Right IJ central line is new in the interval with tip overlying the distal SVC level. Right pleural drain again noted. Diffuse bilateral airspace disease is similar to prior. Telemetry leads overlie the chest. IMPRESSION: 1. New right IJ central line tip overlies the distal SVC level near the junction with the RA. No pneumothorax or pleural effusion. 2. Otherwise no substantial interval change in exam. Diffuse bilateral airspace disease. Electronically Signed   By: Misty Stanley M.D.   On: 12/14/2020 13:08   ECHOCARDIOGRAM COMPLETE  Result Date: 12/30/2020    ECHOCARDIOGRAM REPORT   Patient Name:   HANNAN HUTMACHER Date of Exam: 12/25/2020 Medical Rec #:  [id number]      Height:       70.0 in Accession #:    [id number]     Weight:       264.6 lb Date of Birth:  1951-06-25       BSA:          2.351 m Patient Age:    80 years       BP:           146/63 mmHg Patient Gender: M              HR:           97 bpm. Exam Location:  Inpatient Procedure: 2D Echo, Color Doppler and Cardiac Doppler Indications:    Shock ([place]) [[id number]]  History:        Patient has no prior history of Echocardiogram examinations.                 CHF, Arrythmias:Atrial Fibrillation; Risk Factors:Hypertension                 and Dyslipidemia. [facility] suffering                  acute respiratory failure [id number] pneumonia complicated by                 development of right-sided pneumothorax status post chest tube                 placement.  Sonographer:    Darlina Sicilian RDCS Referring Phys: [id number] GRACE E BOWSER  Sonographer Comments: Technically challenging study  due to limited acoustic windows. IMPRESSIONS  1. Left ventricular ejection fraction, by estimation, is 65 to 70%. The left ventricle has normal function. The left ventricle has no regional wall motion abnormalities. There is mild left ventricular hypertrophy. Left ventricular diastolic parameters are indeterminate.  2. Right ventricular systolic function is normal. The right ventricular size is normal.  3. The pericardial effusion is circumferential.  4. The mitral valve is normal in structure. No evidence of mitral valve regurgitation. No evidence of mitral stenosis.  5. The aortic valve is normal in structure. Aortic valve regurgitation is not visualized. Mild aortic valve sclerosis is present, with no evidence of aortic valve stenosis.  6. The inferior vena cava is dilated in size with >50% respiratory variability, suggesting right atrial pressure of 8 mmHg. FINDINGS  Left Ventricle: Left ventricular ejection fraction, by estimation, is 65 to 70%. The left ventricle has normal function. The left ventricle has no regional wall motion abnormalities. The left ventricular internal cavity size was normal in size. There is  mild left ventricular hypertrophy. Left ventricular diastolic parameters are indeterminate. Right Ventricle: The right ventricular size is normal. No increase in right ventricular wall thickness. Right ventricular systolic function is normal. Left Atrium: Left atrial size was normal in size. Right Atrium: Right atrial size was normal in size. Pericardium: Trivial pericardial effusion is present. The pericardial effusion is circumferential. Mitral Valve: The mitral valve is normal in structure. There is  moderate thickening of the mitral valve leaflet(s). No evidence of mitral valve regurgitation. No evidence of mitral valve stenosis. Tricuspid Valve: The tricuspid valve is normal in structure. Tricuspid valve regurgitation is trivial. No evidence of tricuspid stenosis. Aortic Valve: The aortic valve is normal in structure. Aortic valve regurgitation is not visualized. Mild aortic valve sclerosis is present, with no evidence of aortic valve stenosis. Pulmonic Valve: The pulmonic valve was normal in structure. Pulmonic valve regurgitation is not visualized. No evidence of pulmonic stenosis. Aorta: The aortic root is normal in size and structure. Venous: The inferior vena cava is dilated in size with greater than 50% respiratory variability, suggesting right atrial pressure of 8 mmHg. IAS/Shunts: No atrial level shunt detected by color flow Doppler.  LEFT VENTRICLE PLAX 2D LVIDd:         3.80 cm LVIDs:         2.40 cm LV PW:         1.30 cm LV IVS:        1.20 cm LVOT diam:     1.90 cm LVOT Area:     2.84 cm   AORTA Ao Root diam: 3.40 cm  SHUNTS Systemic Diam: 1.90 cm Candee Furbish MD Electronically signed by Candee Furbish MD Signature Date/Time: 12/27/2020/3:39:59 PM    Final    US LIVER DOPPLER  Result Date: 01/07/2021 CLINICAL DATA:  Abnormal liver function tests, respiratory failure secondary to COVID-19 pneumonia and renal failure. EXAM: DUPLEX ULTRASOUND OF LIVER TECHNIQUE: Color and duplex Doppler ultrasound was performed to evaluate the hepatic in-flow and out-flow vessels. COMPARISON:  None. FINDINGS: Portal Vein Velocities Main:  9-14 cm/sec Right:  31 cm/sec Left:  27 cm/sec Hepatic Vein Velocities Right:  20 cm/sec Middle:  19 cm/sec Left:  21 cm/sec Hepatic Artery Velocity:  343 cm/sec Varices: None visualized. Ascites: None visualized. There is flow in the portal vein but flow appears attenuated and of low velocity, suggestive of portal hypertension. No definite portal vein thrombus identified. No evidence  of hepatic veno-occlusive disease. The  intrahepatic IVC demonstrates normal patency. IMPRESSION: Low main portal vein velocities without definite portal vein thrombus. Findings are suggestive of portal hypertension. Electronically Signed   By: Aletta Edouard M.D.   On: 12/24/2020 17:49   VAS [place] LOWER EXTREMITY VENOUS (DVT)  Result Date: 12/18/2020  Lower Venous DVT Study Indications: Edema, and recent Covid-19 infection.  Limitations: Bair hugger, ventilation, line and bandages. Comparison Study: No prior study Performing Technologist: Sharion Dove RVS  Examination Guidelines: A complete evaluation includes B-mode imaging, spectral Doppler, color Doppler, and power Doppler as needed of all accessible portions of each vessel. Bilateral testing is considered an integral part of a complete examination. Limited examinations for reoccurring indications may be performed as noted. The reflux portion of the exam is performed with the patient in reverse Trendelenburg.  +---------+---------------+---------+-----------+----------+-------------------+ RIGHT    CompressibilityPhasicitySpontaneityPropertiesThrombus Aging      +---------+---------------+---------+-----------+----------+-------------------+ CFV                                                   Not well visualized +---------+---------------+---------+-----------+----------+-------------------+ SFJ                                                   Not well visualized +---------+---------------+---------+-----------+----------+-------------------+ FV Prox  Full           Yes      Yes                                      +---------+---------------+---------+-----------+----------+-------------------+ FV Mid   Full                                                             +---------+---------------+---------+-----------+----------+-------------------+ FV DistalFull                                                              +---------+---------------+---------+-----------+----------+-------------------+ PFV      Full                                                             +---------+---------------+---------+-----------+----------+-------------------+ POP      Full           Yes      Yes                                      +---------+---------------+---------+-----------+----------+-------------------+ PTV  Not well visualized +---------+---------------+---------+-----------+----------+-------------------+ PERO                                                  Not well visualized +---------+---------------+---------+-----------+----------+-------------------+   +---------+---------------+---------+-----------+----------+-------------------+ LEFT     CompressibilityPhasicitySpontaneityPropertiesThrombus Aging      +---------+---------------+---------+-----------+----------+-------------------+ CFV      Full           Yes      Yes                                      +---------+---------------+---------+-----------+----------+-------------------+ SFJ      Full                                                             +---------+---------------+---------+-----------+----------+-------------------+ FV Prox  Full                                                             +---------+---------------+---------+-----------+----------+-------------------+ FV Mid   Full                                                             +---------+---------------+---------+-----------+----------+-------------------+ FV DistalFull                                                             +---------+---------------+---------+-----------+----------+-------------------+ PFV      Full                                                             +---------+---------------+---------+-----------+----------+-------------------+  POP      Full           Yes      Yes                                      +---------+---------------+---------+-----------+----------+-------------------+ PTV                                                   Not well visualized +---------+---------------+---------+-----------+----------+-------------------+ PERO  Not well visualized +---------+---------------+---------+-----------+----------+-------------------+     Summary: RIGHT: - There is no evidence of deep vein thrombosis in the lower extremity. However, portions of this examination were limited- see technologist comments above.  LEFT: - There is no evidence of deep vein thrombosis in the lower extremity. However, portions of this examination were limited- see technologist comments above.  *See table(s) above for measurements and observations. Electronically signed by Monica Martinez MD on 12/18/2020 at 3:46:54 PM.    Final      Assessment/Plan: Sepsis VRE Bacteremia Acute on Chronic Resp Failure Aspiration pneumonia, HCAP             Trach aspirate- rare GPC Pneumothorax COVID 19 (1-14) AKI, ATN due to sepsis, CKD3             Metabolic acidosis with anion gap Epistaxis Shock liver From [facility]  Total days of antibiotics: 3 zosyn/zyvox  WBC remains significantly elevated (stress dose steroids?) as does his Lactate. His pH is 7.119. He is hypothermic.  He is on high dose neo Made DNR last night.  Would continue his current anbx His R chest central line was placed 2-4 and he may not tolerated line change. Kept in place His prognosis is worsening.          Bobby Rumpf MD, FACP Infectious Diseases (pager) [phone] [email or web address] 01/03/21, 9:32 AM  LOS: 2 days

## 2021-01-11 NOTE — Progress Notes (Addendum)
NAME:  Matthew Oliver, MRN:  [id number], DOB:  12/04/1950, LOS: 2 ADMISSION DATE:  12/29/2020, CONSULTATION DATE:  2/4 REFERRING MD:  [facility], CHIEF COMPLAINT:  Renal failure, shock  Brief History:  70 yo M from [facility] following COVID19 PNA who has hypoxemic respiratory failure, ptx (s/p pigtail), renal failure, and shock (likely septic)   History of Present Illness:  70 yo M PMH COVID-19 PNA, HTN, HLD who has been at [facility] following COVID-19 PNA (postive date 1/14), transferred to MICU 2/4 with worsening renal failure, shock, and progressive hypoxemia on MV. Previously pt DNR. Notably, pt to ED 2/3 with ptx and R pigtail was placed at that time. 2/3 [facility] EDP also d/w physician at [facility] concerns  About pt new pressor requirement, but pt taken back to [facility] per [facility].  In review of labs, looks like 2/3 pt had new hyperkalemia and AKI with Cr 3.4 from 1.3.  On 2/4 the patient had worsening renal failure and worsening shock, which prompted transfer from [facility] to ICU.   Past Medical History:  COVID 19 PNA Afib Acute on chronic respiratory failure HTN HLD  Significant Hospital Events:  2/4 transferred to MICU from [facility]. Renal failure, worsening shock. Intubated, on high vent settings and SpO2 88% 2/5 worsening shock 2/6 worsening shock, unable to adequately ventilate likely in setting of rising LA worsened with liver failure  Consults:  nephro  Procedures:  2/3 R pigtail (placed in ED) 2/4 CVC R IJ (placed at [facility])  2/4 HD cath   Significant Diagnostic Tests:  2/4 CXR >>  Micro Data:  Prior COVID +  2/4 BCx> 2/4 Tracheal aspirate>  Antimicrobials:  2/4 vanc> 2/5 2/4 zosyn> linezolid 2/5 >  Interim History / Subjective:  Worsening shock, sats low 80s, high 70s on ABGs. Ph worsening  Objective   Blood pressure (!) 111/95, pulse (!) 166, temperature (!) 92 F (33.3 C), temperature source Axillary, resp. rate (!) 25, weight 129.9 kg, SpO2 (!) 78  %.    Vent Mode: PRVC FiO2 (%):  [100 %] 100 % Set Rate:  [30 bmp] 30 bmp Vt Set:  [580 mL] 580 mL PEEP:  [10 cmH20] 10 cmH20 Plateau Pressure:  [27 cmH20-29 cmH20] 27 cmH20   Intake/Output Summary (Last 24 hours) at 01/09/2021 1035 Last data filed at 12/21/2020 1000 Gross per 24 hour  Intake 7195.11 ml  Output 4945 ml  Net 2250.11 ml   Filed Weights   Dec 29, 2020 1530 12/18/20 0421 01/02/2021 0257  Weight: 128.8 kg 129.1 kg 129.9 kg    Examination: General: Critically ill appearing older adult M, intubated sedated NAD HENT: ETT secure. Bilateral nares packed and saturated with blood, improved from prior. Trachea midline  Lungs: R chest tube. Mechanically ventilated. Rhonchi Cardiovascular: rrr sluggish cap refill s1s2 Abdomen: round nd nt Extremities:  Neuro: Sedated. Not following commands.  Resolved Hospital Problem list     Assessment & Plan:   Acute respiratory failure with hypoxemia and hypercarbia requiring MV -pt recently reversed code status R PTX s/p chest tube placement 2/3 Recent COVID-19 PNA P -Full MV support -VAP, PAD -routine chest tube management - no air leak currently  Septic shock: VRE pneumonia and bacteremia.  Lactic acidosis P -zosyn, linezolid -stress dose steroids -Line placed 2/4 in setting of shock and after decompensation, clinically not source of bacteremia, will keep for now, unable to give line holiday given multiple drips keeping him alive  Acute liver failure: In setting of likely undiagnosed  cirrhosis, portal HTN on liver doppler. Shock liver, LFTs in thousands, INR elevated not clearing lactate. --Hep panel negative, no PV clot --Grim prognosis  Epistaxis, with ongoing bleeding -likely r/t trauma from NGT attempts + apixaban for Afib.  -ongoing bleeding likely worsened due to uremia P -rhino rockets  -Labs not consistent with DIC  Acute renal failure with anuria and uremia  -seen by nephro at [facility] prior to transfer -renal US  2/4 without etiology for renal failure Hyperkalemia AGMA P -CRRT still with acidemia -continue bicarb gtt for now   Hx Afib -ICU monitoring -optimize electrolytes -not on hep gtt -- ongoing epistaxis,  Hypocalcemia Hypernatremia, mild P -replace Ca, cont to trend   Hyperglycemia P -SSI  Best practice (evaluated daily)  Diet: npo Pain/Anxiety/Delirium protocol (if indicated): fent, PRN versed VAP protocol (if indicated): yes DVT prophylaxis: SCD GI prophylaxis: protonix  Glucose control: SSI Mobility: BR Disposition:ICU  Goals of Care:  Last date of multidisciplinary goals of care discussion: 2/5 family updated, worsening, almost certainly will die, recommended DNR, contemplative, worsened overnight and agreed to DNR Family and staff present: MD, wife Summary of discussion:   Follow up goals of care discussion due: 2/11 Code Status: DNR  Labs   CBC: Recent Labs  Lab 12/16/20 0516 12/16/20 0922 Jan 16, 2021 0509 2021/01/16 1259 01-16-2021 1421 January 16, 2021 2129 12/18/20 0005 12/18/20 0915 12/27/2020 0251 12/22/2020 0331  WBC 19.2*  --  30.9* 32.1*  --   --  40.3*  --  40.0*  --   HGB 18.4*   < > 13.9 12.9*   < > 13.6 14.0 15.0 12.8* 12.6*  HCT 62.2*   < > 44.1 40.0   < > 40.0 44.6 44.0 43.8 37.0*  MCV 107.4*  --  104.8* 101.5*  --   --  103.0*  --  107.9*  --   PLT 181  --  PLATELET CLUMPS NOTED ON SMEAR, COUNT APPEARS DECREASED 58*  56*  --   --  54*  --  48*  --    < > = values in this interval not displayed.    Basic Metabolic Panel: Recent Labs  Lab January 16, 2021 1259 16-Jan-2021 1421 01/16/21 1640 01-16-2021 2129 12/18/20 0005 12/18/20 0412 12/18/20 0915 12/18/20 1528 12/18/20 2112 01/06/2021 0251 12/20/2020 0310 12/26/2020 0331  NA 143   < > 144   < >  --  139 138 136 138  --  137 135  K 4.9   < > 4.9   < >  --  4.7 4.8 5.5* 4.5  --  4.8 4.3  CL 100  --  100  --   --  98  --  98 99  --  99  --   CO2 22  --  22  --   --  21*  --  19* 17*  --  16*  --   GLUCOSE 277*   --  191*  --   --  148*  --  104* 129*  --  85  --   BUN 160*  --  162*  --   --  113*  --  76* 65*  --  51*  --   CREATININE 5.45*  --  5.44*  --   --  3.92*  --  3.26* 3.14*  --  2.72*  --   CALCIUM 5.2*  --  5.4*  --   --  6.3*  --  6.5* 6.6*  --  6.7*  --  MG 2.8*  --   --   --  2.7*  --   --   --  2.6* 2.6*  --   --   PHOS  --   --  11.5*  --   --  8.6*  --  8.2*  --   --  8.0*  --    < > = values in this interval not displayed.   GFR: Estimated Creatinine Clearance: 34.7 mL/min (A) (by C-G formula based on SCr of 2.72 mg/dL (H)). Recent Labs  Lab 12/18/2020 0509 01/01/2021 1259 Dec 19, 2020 1640 12/18/20 0005 12/18/20 1423 12/15/2020 0251  WBC 30.9* 32.1*  --  40.3*  --  40.0*  LATICACIDVEN 4.9* 4.7* 4.1*  --  7.6*  --     Liver Function Tests: Recent Labs  Lab 12/16/2020 1259 12/30/2020 1640 12/18/20 0005 12/18/20 0412 12/18/20 1528 12/28/2020 0251 12/22/2020 0310  AST 3,276*  --  2,818*  --   --  5,141*  --   ALT 2,332*  --  2,330*  --   --  4,065*  --   ALKPHOS 98  --  111  --   --  163*  --   BILITOT 3.1*  --  3.9*  --   --  4.8*  --   PROT 4.9*  --  5.5*  --   --  4.8*  --   ALBUMIN 1.5*   < > 1.8* 1.8* 1.8* 1.4* 1.4*   < > = values in this interval not displayed.   No results for input(s): LIPASE, AMYLASE in the last 168 hours. No results for input(s): AMMONIA in the last 168 hours.  ABG    Component Value Date/Time   PHART 7.119 (LL) 01/03/2021 0331   PCO2ART 55.3 (H) 01/02/2021 0331   PO2ART 66 (L) 12/22/2020 0331   HCO3 18.6 (L) 01/10/2021 0331   TCO2 20 (L) 12/16/2020 0331   ACIDBASEDEF 12.0 (H) 01/05/2021 0331   O2SAT 89.0 01/08/2021 0331     Coagulation Profile: Recent Labs  Lab 01/01/2021 1259  INR 6.3*    Cardiac Enzymes: No results for input(s): CKTOTAL, CKMB, CKMBINDEX, TROPONINI in the last 168 hours.  HbA1C: Hgb A1c MFr Bld  Date/Time Value Ref Range Status  12/24/2020 12:59 PM 6.8 (H) 4.8 - 5.6 % Final    Comment:    (NOTE) Pre diabetes:           5.7%-6.4%  Diabetes:              >6.4%  Glycemic control for   <7.0% adults with diabetes     CBG: Recent Labs  Lab 12/18/20 2304 01/04/2021 0309 01/05/2021 0330 01/05/2021 0520 01/07/2021 0728  GLUCAP 144* 86 211* 150* 173*    Review of Systems:   Unable to obtain, intubated and sedated   Past Medical History:  He,  has a past medical history of Acute on chronic respiratory failure with hypoxia (HCC), Chronic atrial fibrillation (HCC), Community acquired pneumonia of both lower lobes, COVID-19 virus infection, and Pneumonia due to COVID-19 virus.   Surgical History:  Unavailable   Social History:      Family History:  His family history is not on file.   Allergies No Known Allergies   Home Medications  Prior to Admission medications   Not on File     Critical care time:      CRITICAL CARE Performed by: Karren Burly   Total critical care time: 33 minutes  Critical care  time was exclusive of separately billable procedures and treating other patients. Critical care was necessary to treat or prevent imminent or life-threatening deterioration.  Critical care was time spent personally by me on the following activities: development of treatment plan with patient and/or surrogate as well as nursing, discussions with consultants, evaluation of patient's response to treatment, examination of patient, obtaining history from patient or surrogate, ordering and performing treatments and interventions, ordering and review of laboratory studies, ordering and review of radiographic studies, pulse oximetry and re-evaluation of patient's condition.  Karren Burly, MD

## 2021-01-11 NOTE — Progress Notes (Signed)
I notified Dr. Roxan Hockey of the patient being maxed out on all three pressors and that Im unable to pull any fluid off with CRRT. Patient still continuing to decline in status. I asked Dr. Roxan Hockey if he would call Gareth Eagle, the patients wife and notify her of his declining health status. I received a call from [place] and she asked that Dr. Roxan Hockey return her call because she wanted to make her husband a DNR but still continue everything that we are doing. I notified elink and patient is now a DNR. Bracelet applied to patients arm.

## 2021-01-11 NOTE — Death Summary Note (Signed)
DEATH SUMMARY   Patient Details  Name: Mar Walmer MRN: [id number] DOB: 07-06-1951  Admission/Discharge Information   Admit Date:  Jan 16, 2021  Date of Death: Date of Death: 01-18-21  Time of Death: Time of Death: 04-19-1430  Length of Stay: 2  Referring Physician: Patient, No Pcp Per   Reason(s) for Hospitalization  septic shock, acute renal failure, acute on chronic hypoxemic/hypercarbic respiratory failure  Diagnoses  Preliminary cause of death: septic shock Secondary Diagnoses (including complications and co-morbidities):  Principal Problem:   Septic shock (HCC) Active Problems:   Acute renal failure (ARF) (HCC)   Pressure injury of skin   Ileus (HCC)   Acute respiratory failure with hypoxia and hypercapnia (HCC)   Pneumothorax, traumatic   VRE (vancomycin resistant enterococcus) culture positive   Acute liver failure without hepatic coma   Brief Hospital Course (including significant findings, care, treatment, and services provided and events leading to death)  Romulo Okray is a 70 y.o. year old male who had long admission 11/2020 for COIVD pneumonia transferred to [facility] who developed PTX s/p chest tube and worsening respiratory failure and shock with renal failure prompting transfer to [facility] at [facility]. Started CRRT. On multiple pressors. Refractory hypoxemia and significant hypercarbia. Developed hepatic failure. Could not clear lactate. Had worsening hypotension and refractory acidemia despite all aggressive measures. Found to have VRE bacteremia, source pneumonia. Given continuous decline, family elected to make patient comfort care 2/6. He died shortly after support devices removed. His belongings were transported with his body when he left the unit.    Pertinent Labs and Studies  Significant Diagnostic Studies DG Chest 1 View  Result Date: 12/07/2020 CLINICAL DATA:  Respiratory failure. EXAM: CHEST  1 VIEW COMPARISON:  No prior. FINDINGS: Cardiomegaly. Diffuse bilateral  interstitial infiltrates/edema. Low lung volumes. No pleural effusion or pneumothorax. Metallic fragments noted over the right chest. No acute bony abnormality identified. Thoracic spine scoliosis and degenerative change. IMPRESSION: 1. Cardiomegaly. 2. Diffuse bilateral interstitial infiltrates/edema. Low lung volumes. Electronically Signed   By: Maisie Fus  Register   On: 12/07/2020 06:07   DG Abd 1 View  Result Date: 01/16/2021 CLINICAL DATA:  70 year old male with with NG. EXAM: ABDOMEN - 1 VIEW COMPARISON:  Abdominal radiograph dated 12/16/2020. FINDINGS: Enteric tube with tip in the distal stomach. Right sided chest tube. Bilateral confluent and streaky airspace opacities noted. A central venous line with tip at the cavoatrial junction. Right femoral line noted. No bowel dilatation or evidence of obstruction. The osseous structures and soft tissues are grossly unremarkable. IMPRESSION: Enteric tube with tip in the distal stomach. Electronically Signed   By: Elgie Collard M.D.   On: 16-Jan-2021 18:29   DG Abd 1 View  Result Date: 12/16/2020 CLINICAL DATA:  Orogastric tube placement. EXAM: ABDOMEN - 1 VIEW COMPARISON:  December 09, 2020. FINDINGS: The bowel gas pattern is normal. Distal tip of nasogastric tube appears to be in the distal stomach. No radio-opaque calculi or other significant radiographic abnormality are seen. IMPRESSION: Distal tip of nasogastric tube appears to be in the distal stomach. Electronically Signed   By: Lupita Raider M.D.   On: 12/16/2020 13:24   DG Abd 1 View  Result Date: 12/09/2020 CLINICAL DATA:  Check NG tube EXAM: ABDOMEN - 1 VIEW COMPARISON:  None. FINDINGS: Weighted feeding catheter is noted with the tip in the second portion of the duodenum. Scattered large and small bowel gas is noted. IMPRESSION: Dobbhoff catheter in the second portion of the duodenum. Electronically Signed  By: Alcide Clever M.D.   On: 12/09/2020 16:24   US RENAL  Result Date:  01/10/2021 CLINICAL DATA:  Acute renal injury EXAM: RENAL / URINARY TRACT ULTRASOUND COMPLETE COMPARISON:  None. FINDINGS: Right Kidney: Renal measurements: 12.7 x 6.0 x 6.3 cm. = volume: 250 mL. Echogenicity within normal limits. No mass or hydronephrosis visualized. Left Kidney: Renal measurements: 13.9 x 5.6 x 5.8 cm. = volume: 234 mL. Echogenicity within normal limits. No mass or hydronephrosis visualized. Bladder: Decompressed by Foley catheter Other: None. IMPRESSION: Unremarkable kidneys. Electronically Signed   By: Alcide Clever M.D.   On: 01/10/2021 01:30   DG Chest Port 1 View  Result Date: 12/18/2020 CLINICAL DATA:  Congestive heart failure. EXAM: PORTABLE CHEST 1 VIEW COMPARISON:  10-Jan-2021. FINDINGS: The heart size and mediastinal contours are within normal limits. Endotracheal and nasogastric tubes are unchanged in position. Right internal jugular catheter is unchanged. Stable position of right-sided chest tube. No pneumothorax is noted. Stable bilateral lung opacities are noted concerning for multifocal pneumonia. The visualized skeletal structures are unremarkable. IMPRESSION: Stable support apparatus. Stable bilateral lung opacities are noted concerning for multifocal pneumonia. Electronically Signed   By: Lupita Raider M.D.   On: 12/18/2020 08:32   DG CHEST PORT 1 VIEW  Result Date: 01/10/2021 CLINICAL DATA:  Central line placement. EXAM: PORTABLE CHEST 1 VIEW COMPARISON:  Earlier same day FINDINGS: 1259 hours. Endotracheal tube tip is approximately 4.8 cm above the base of the carina. The NG tube passes into the stomach although the distal tip position is not included on the film. Right IJ central line is new in the interval with tip overlying the distal SVC level. Right pleural drain again noted. Diffuse bilateral airspace disease is similar to prior. Telemetry leads overlie the chest. IMPRESSION: 1. New right IJ central line tip overlies the distal SVC level near the junction with  the RA. No pneumothorax or pleural effusion. 2. Otherwise no substantial interval change in exam. Diffuse bilateral airspace disease. Electronically Signed   By: Kennith Center M.D.   On: 01-10-2021 13:08   DG Chest Port 1 View  Result Date: 2021-01-10 CLINICAL DATA:  Hypoxemia EXAM: PORTABLE CHEST 1 VIEW COMPARISON:  Yesterday FINDINGS: Endotracheal tube with tip just below the clavicular heads. An enteric tube reaches the stomach. Right-sided chest tube in place. Artifact from EKG leads. Cardiomegaly and confluent airspace disease. No visible effusion or pneumothorax. IMPRESSION: 1. Unremarkable hardware. 2. Unchanged extensive airspace disease. Electronically Signed   By: Marnee Spring M.D.   On: 2021/01/10 06:10   DG Chest Portable 1 View  Result Date: 12/16/2020 CLINICAL DATA:  Hypoxia. EXAM: PORTABLE CHEST 1 VIEW COMPARISON:  Chest x-ray from same day at 0809 hours. FINDINGS: Endotracheal tube tip 6.1 cm above the carina. Interval retraction of the feeding tube with the tip now in the upper esophagus. Unchanged right-sided chest tube. No residual pneumothorax. Extensive bilateral interstitial and hazy airspace opacities are unchanged. No large pleural effusion. Stable cardiomediastinal silhouette. IMPRESSION: 1. Interval retraction of the feeding tube with the tip now in the upper esophagus. Recommend repositioning. 2. Unchanged multifocal pneumonia. Electronically Signed   By: Obie Dredge M.D.   On: 12/16/2020 11:14   DG Chest Portable 1 View  Result Date: 12/16/2020 CLINICAL DATA:  RIGHT pneumothorax post thoracostomy tube insertion, hypoxia, COVID-19 EXAM: PORTABLE CHEST 1 VIEW COMPARISON:  Portable exam 0809 hours compared to 0557 hours FINDINGS: Tip of endotracheal tube projects 6.9 cm above carina. Feeding  tube extends into abdomen. New RIGHT pigtail thoracostomy tube. Near-complete of previously identified RIGHT pneumothorax, tiny residual at lung base. Diffuse BILATERAL pulmonary  infiltrates consistent with multifocal pneumonia and COVID-19. IMPRESSION: Near complete resolution of RIGHT pneumothorax following chest tube insertion. Persistent diffuse BILATERAL pulmonary infiltrates consistent with multifocal pneumonia and COVID-19. Electronically Signed   By: Ulyses Southward M.D.   On: 12/16/2020 08:21   DG CHEST PORT 1 VIEW  Result Date: 12/16/2020 CLINICAL DATA:  Intubation.  Hypoxia.  COVID. EXAM: PORTABLE CHEST 1 VIEW COMPARISON:  Chest x-ray 12/15/2020. FINDINGS: Endotracheal tube noted stable position. Feeding tube noted with tip in the upper most portion of the stomach. Heart size stable. New moderate sized right pneumothorax noted. Diffuse bilateral pulmonary infiltrates again noted. No prominent pleural effusion. Costophrenic angles incompletely imaged. Small metallic fragments again noted over the right chest. IMPRESSION: 1. Endotracheal tube in stable position. Feeding tube noted with tip in the upper most portion of the stomach. 2. New moderate-sized right pneumothorax. 3. Diffuse bilateral pulmonary infiltrates again noted. Critical Value/emergent results were called by telephone at the time of interpretation on 12/16/2020 at 6:09 am to nurse Morrie Sheldon, who verbally acknowledged these results. Electronically Signed   By: Maisie Fus  Register   On: 12/16/2020 06:10   DG Chest Port 1 View  Result Date: 12/15/2020 CLINICAL DATA:  Intubation. EXAM: PORTABLE CHEST 1 VIEW COMPARISON:  12/15/2020 FINDINGS: The endotracheal tube terminates at the thoracic inlet, approximately 8 cm above the carina. The enteric tube extends below the left hemidiaphragm with the heart size is enlarged. There is vascular congestion and diffuse bilateral hazy interstitial prominence. There is no pneumothorax. There are probable bilateral pleural effusions. IMPRESSION: 1. Lines and tubes as above. Consider further advancing the endotracheal tube by approximately 1-2 cm. 2. Otherwise, no significant interval change  in appearance of both lung fields. Electronically Signed   By: Katherine Mantle M.D.   On: 12/15/2020 15:52   DG CHEST PORT 1 VIEW  Result Date: 12/15/2020 CLINICAL DATA:  History of COVID. EXAM: PORTABLE CHEST 1 VIEW COMPARISON:  12/12/2020. FINDINGS: Feeding tube in stable position. Stable cardiomegaly. Diffuse bilateral interstitial prominence consistent with interstitial edema and or pneumonitis again noted. Slight improvement from prior exam. No pleural effusion or pneumothorax. Tiny metallic densities again noted over the right chest. IMPRESSION: 1. Stable cardiomegaly. 2. Diffuse bilateral interstitial prominence consistent with interstitial edema and or pneumonitis again noted. Slight improvement from prior exam. Electronically Signed   By: Maisie Fus  Register   On: 12/15/2020 06:13   DG CHEST PORT 1 VIEW  Result Date: 12/12/2020 CLINICAL DATA:  Acute on chronic respiratory failure. EXAM: PORTABLE CHEST 1 VIEW COMPARISON:  Chest x-ray 12/07/2020 FINDINGS: Enteric tube coursing below the hemidiaphragm with tip collimated off view. The heart size and mediastinal contours are unchanged with persistent cardiomegaly. Interval worsening of diffuse patchy airspace and interstitial opacities. No pleural effusion. No pneumothorax. No acute osseous abnormality. Retained metallic densities at the right base again noted. IMPRESSION: 1. Interval worsening of diffuse patchy airspace and interstitial opacities. 2. Enteric tube courses below the hemidiaphragm with tip collimated off view. Electronically Signed   By: Tish Frederickson M.D.   On: 12/12/2020 06:50   ECHOCARDIOGRAM COMPLETE  Result Date: 12/16/2020    ECHOCARDIOGRAM REPORT   Patient Name:   AVIGDOR DOLLAR Date of Exam: 01/07/2021 Medical Rec #:  [id number]      Height:       70.0 in Accession #:    [id number]  Weight:       264.6 lb Date of Birth:  15-Dec-1950       BSA:          2.351 m Patient Age:    69 years       BP:           146/63 mmHg Patient  Gender: M              HR:           97 bpm. Exam Location:  Inpatient Procedure: 2D Echo, Color Doppler and Cardiac Doppler Indications:    [facility]) [[id number]]  History:        Patient has no prior history of Echocardiogram examinations.                 CHF, Arrythmias:Atrial Fibrillation; Risk Factors:Hypertension                 and Dyslipidemia. [facility] suffering                 acute respiratory failure COVID-19 pneumonia complicated by                 development of right-sided pneumothorax status post chest tube                 placement.  Sonographer:    Leta Jungling RDCS Referring Phys: [id number] GRACE E BOWSER  Sonographer Comments: Technically challenging study due to limited acoustic windows. IMPRESSIONS  1. Left ventricular ejection fraction, by estimation, is 65 to 70%. The left ventricle has normal function. The left ventricle has no regional wall motion abnormalities. There is mild left ventricular hypertrophy. Left ventricular diastolic parameters are indeterminate.  2. Right ventricular systolic function is normal. The right ventricular size is normal.  3. The pericardial effusion is circumferential.  4. The mitral valve is normal in structure. No evidence of mitral valve regurgitation. No evidence of mitral stenosis.  5. The aortic valve is normal in structure. Aortic valve regurgitation is not visualized. Mild aortic valve sclerosis is present, with no evidence of aortic valve stenosis.  6. The inferior vena cava is dilated in size with >50% respiratory variability, suggesting right atrial pressure of 8 mmHg. FINDINGS  Left Ventricle: Left ventricular ejection fraction, by estimation, is 65 to 70%. The left ventricle has normal function. The left ventricle has no regional wall motion abnormalities. The left ventricular internal cavity size was normal in size. There is  mild left ventricular hypertrophy. Left ventricular diastolic parameters are indeterminate. Right  Ventricle: The right ventricular size is normal. No increase in right ventricular wall thickness. Right ventricular systolic function is normal. Left Atrium: Left atrial size was normal in size. Right Atrium: Right atrial size was normal in size. Pericardium: Trivial pericardial effusion is present. The pericardial effusion is circumferential. Mitral Valve: The mitral valve is normal in structure. There is moderate thickening of the mitral valve leaflet(s). No evidence of mitral valve regurgitation. No evidence of mitral valve stenosis. Tricuspid Valve: The tricuspid valve is normal in structure. Tricuspid valve regurgitation is trivial. No evidence of tricuspid stenosis. Aortic Valve: The aortic valve is normal in structure. Aortic valve regurgitation is not visualized. Mild aortic valve sclerosis is present, with no evidence of aortic valve stenosis. Pulmonic Valve: The pulmonic valve was normal in structure. Pulmonic valve regurgitation is not visualized. No evidence of pulmonic stenosis. Aorta: The aortic root is normal in size and structure. Venous: The  inferior vena cava is dilated in size with greater than 50% respiratory variability, suggesting right atrial pressure of 8 mmHg. IAS/Shunts: No atrial level shunt detected by color flow Doppler.  LEFT VENTRICLE PLAX 2D LVIDd:         3.80 cm LVIDs:         2.40 cm LV PW:         1.30 cm LV IVS:        1.20 cm LVOT diam:     1.90 cm LVOT Area:     2.84 cm   AORTA Ao Root diam: 3.40 cm  SHUNTS Systemic Diam: 1.90 cm Donato Schultz MD Electronically signed by Donato Schultz MD Signature Date/Time: 2020/12/29/3:39:59 PM    Final    US LIVER DOPPLER  Result Date: 2020/12/29 CLINICAL DATA:  Abnormal liver function tests, respiratory failure secondary to COVID-19 pneumonia and renal failure. EXAM: DUPLEX ULTRASOUND OF LIVER TECHNIQUE: Color and duplex Doppler ultrasound was performed to evaluate the hepatic in-flow and out-flow vessels. COMPARISON:  None. FINDINGS: Portal  Vein Velocities Main:  9-14 cm/sec Right:  31 cm/sec Left:  27 cm/sec Hepatic Vein Velocities Right:  20 cm/sec Middle:  19 cm/sec Left:  21 cm/sec Hepatic Artery Velocity:  343 cm/sec Varices: None visualized. Ascites: None visualized. There is flow in the portal vein but flow appears attenuated and of low velocity, suggestive of portal hypertension. No definite portal vein thrombus identified. No evidence of hepatic veno-occlusive disease. The intrahepatic IVC demonstrates normal patency. IMPRESSION: Low main portal vein velocities without definite portal vein thrombus. Findings are suggestive of portal hypertension. Electronically Signed   By: Irish Lack M.D.   On: 12/29/20 17:49   VAS [place] LOWER EXTREMITY VENOUS (DVT)  Result Date: 12/18/2020  Lower Venous DVT Study Indications: Edema, and recent Covid-19 infection.  Limitations: Bair hugger, ventilation, line and bandages. Comparison Study: No prior study Performing Technologist: Sherren Kerns RVS  Examination Guidelines: A complete evaluation includes B-mode imaging, spectral Doppler, color Doppler, and power Doppler as needed of all accessible portions of each vessel. Bilateral testing is considered an integral part of a complete examination. Limited examinations for reoccurring indications may be performed as noted. The reflux portion of the exam is performed with the patient in reverse Trendelenburg.  +---------+---------------+---------+-----------+----------+-------------------+ RIGHT    CompressibilityPhasicitySpontaneityPropertiesThrombus Aging      +---------+---------------+---------+-----------+----------+-------------------+ CFV                                                   Not well visualized +---------+---------------+---------+-----------+----------+-------------------+ SFJ                                                   Not well visualized  +---------+---------------+---------+-----------+----------+-------------------+ FV Prox  Full           Yes      Yes                                      +---------+---------------+---------+-----------+----------+-------------------+ FV Mid   Full                                                             +---------+---------------+---------+-----------+----------+-------------------+  FV DistalFull                                                             +---------+---------------+---------+-----------+----------+-------------------+ PFV      Full                                                             +---------+---------------+---------+-----------+----------+-------------------+ POP      Full           Yes      Yes                                      +---------+---------------+---------+-----------+----------+-------------------+ PTV                                                   Not well visualized +---------+---------------+---------+-----------+----------+-------------------+ PERO                                                  Not well visualized +---------+---------------+---------+-----------+----------+-------------------+   +---------+---------------+---------+-----------+----------+-------------------+ LEFT     CompressibilityPhasicitySpontaneityPropertiesThrombus Aging      +---------+---------------+---------+-----------+----------+-------------------+ CFV      Full           Yes      Yes                                      +---------+---------------+---------+-----------+----------+-------------------+ SFJ      Full                                                             +---------+---------------+---------+-----------+----------+-------------------+ FV Prox  Full                                                             +---------+---------------+---------+-----------+----------+-------------------+ FV  Mid   Full                                                             +---------+---------------+---------+-----------+----------+-------------------+ FV DistalFull                                                             +---------+---------------+---------+-----------+----------+-------------------+  PFV      Full                                                             +---------+---------------+---------+-----------+----------+-------------------+ POP      Full           Yes      Yes                                      +---------+---------------+---------+-----------+----------+-------------------+ PTV                                                   Not well visualized +---------+---------------+---------+-----------+----------+-------------------+ PERO                                                  Not well visualized +---------+---------------+---------+-----------+----------+-------------------+     Summary: RIGHT: - There is no evidence of deep vein thrombosis in the lower extremity. However, portions of this examination were limited- see technologist comments above.  LEFT: - There is no evidence of deep vein thrombosis in the lower extremity. However, portions of this examination were limited- see technologist comments above.  *See table(s) above for measurements and observations. Electronically signed by Sherald Hesshristopher Clark MD on 12/18/2020 at 3:46:54 PM.    Final     Microbiology Recent Results (from the past 240 hour(s))  Culture, blood (routine x 2)     Status: None   Collection Time: 12/11/20  2:45 PM   Specimen: BLOOD LEFT HAND  Result Value Ref Range Status   Specimen Description BLOOD LEFT HAND  Final   Special Requests   Final    BOTTLES DRAWN AEROBIC ONLY Blood Culture adequate volume   Culture   Final    NO GROWTH 5 DAYS Performed at [facility], [address] [place], [place] [address]    Report Status 12/16/2020 FINAL   Final  Culture, blood (routine x 2)     Status: None   Collection Time: 12/11/20  2:59 PM   Specimen: BLOOD  Result Value Ref Range Status   Specimen Description BLOOD LEFT ANTECUBITAL  Final   Special Requests   Final    BOTTLES DRAWN AEROBIC ONLY Blood Culture adequate volume   Culture   Final    NO GROWTH 5 DAYS Performed at [facility], [address] [place], [place] [address]    Report Status 12/16/2020 FINAL  Final  Culture, blood (routine x 2)     Status: Abnormal (Preliminary result)   Collection Time: 08-30-2021  1:08 PM   Specimen: BLOOD  Result Value Ref Range Status   Specimen Description BLOOD FEMORAL ARTERY  Final   Special Requests   Final    BOTTLES DRAWN AEROBIC AND ANAEROBIC Blood Culture adequate volume   Culture  Setup Time   Final    GRAM POSITIVE COCCI IN PAIRS IN CHAINS AEROBIC BOTTLE ONLY CRITICAL  RESULT CALLED TO, READ BACK BY AND VERIFIED WITH: CATHY PIERCE PHARMD @1035  12/18/20 EB    Culture (A)  Final    ENTEROCOCCUS FAECIUM SUSCEPTIBILITIES TO FOLLOW Performed at [facility], [address] [place], [place]    Report Status PENDING  Incomplete  Blood Culture ID Panel (Reflexed)     Status: Abnormal   Collection Time: 01/09/2021  1:08 PM  Result Value Ref Range Status   Enterococcus faecalis NOT DETECTED NOT DETECTED Final   Enterococcus Faecium DETECTED (A) NOT DETECTED Final    Comment: CRITICAL RESULT CALLED TO, READ BACK BY AND VERIFIED WITH: CATHY PIERCE PHARMD @1035  12/18/20 EB    Listeria monocytogenes NOT DETECTED NOT DETECTED Final   Staphylococcus species NOT DETECTED NOT DETECTED Final   Staphylococcus aureus (BCID) NOT DETECTED NOT DETECTED Final   Staphylococcus epidermidis NOT DETECTED NOT DETECTED Final   Staphylococcus lugdunensis NOT DETECTED NOT DETECTED Final   Streptococcus species NOT DETECTED NOT DETECTED Final   Streptococcus agalactiae NOT DETECTED NOT DETECTED Final   Streptococcus pneumoniae NOT  DETECTED NOT DETECTED Final   Streptococcus pyogenes NOT DETECTED NOT DETECTED Final   A.calcoaceticus-baumannii NOT DETECTED NOT DETECTED Final   Bacteroides fragilis NOT DETECTED NOT DETECTED Final   Enterobacterales NOT DETECTED NOT DETECTED Final   Enterobacter cloacae complex NOT DETECTED NOT DETECTED Final   Escherichia coli NOT DETECTED NOT DETECTED Final   Klebsiella aerogenes NOT DETECTED NOT DETECTED Final   Klebsiella oxytoca NOT DETECTED NOT DETECTED Final   Klebsiella pneumoniae NOT DETECTED NOT DETECTED Final   Proteus species NOT DETECTED NOT DETECTED Final   Salmonella species NOT DETECTED NOT DETECTED Final   Serratia marcescens NOT DETECTED NOT DETECTED Final   Haemophilus influenzae NOT DETECTED NOT DETECTED Final   Neisseria meningitidis NOT DETECTED NOT DETECTED Final   Pseudomonas aeruginosa NOT DETECTED NOT DETECTED Final   Stenotrophomonas maltophilia NOT DETECTED NOT DETECTED Final   Candida albicans NOT DETECTED NOT DETECTED Final   Candida auris NOT DETECTED NOT DETECTED Final   Candida glabrata NOT DETECTED NOT DETECTED Final   Candida krusei NOT DETECTED NOT DETECTED Final   Candida parapsilosis NOT DETECTED NOT DETECTED Final   Candida tropicalis NOT DETECTED NOT DETECTED Final   Cryptococcus neoformans/gattii NOT DETECTED NOT DETECTED Final   Vancomycin resistance DETECTED (A) NOT DETECTED Final    Comment: CRITICAL RESULT CALLED TO, READ BACK BY AND VERIFIED WITH: PHARMD @1035  12/18/20 EB Performed at [facility], [address], [place], [facility] [address]   Culture, blood (routine x 2)     Status: None (Preliminary result)   Collection Time: 12/21/2020  1:40 PM   Specimen: BLOOD LEFT FOREARM  Result Value Ref Range Status   Specimen Description BLOOD LEFT FOREARM  Final   Special Requests   Final    BOTTLES DRAWN AEROBIC AND ANAEROBIC Blood Culture adequate volume   Culture   Final    NO GROWTH 2 DAYS Performed at [facility], [address] [place], [facility] [address]    Report Status PENDING  Incomplete  Culture, respiratory (tracheal aspirate)     Status: None (Preliminary result)   Collection Time: 12/18/2020  8:54 PM   Specimen: Tracheal Aspirate; Respiratory  Result Value Ref Range Status   Specimen Description TRACHEAL ASPIRATE  Final   Special Requests NONE  Final   Gram Stain NO WBC SEEN RARE GRAM POSITIVE COCCI   Final  Culture   Final    CULTURE REINCUBATED FOR BETTER GROWTH Performed at [facility], [address] [place], [place] [address]    Report Status PENDING  Incomplete  Culture, blood (Routine X 2) w Reflex to ID Panel     Status: None (Preliminary result)   Collection Time: 12/18/20  3:11 PM   Specimen: BLOOD LEFT HAND  Result Value Ref Range Status   Specimen Description BLOOD LEFT HAND  Final   Special Requests   Final    BOTTLES DRAWN AEROBIC ONLY Blood Culture results may not be optimal due to an inadequate volume of blood received in culture bottles   Culture   Final    NO GROWTH < 24 HOURS Performed at [facility], [address], [place], [place] [address]    Report Status PENDING  Incomplete    Lab Basic Metabolic Panel: Recent Labs  Lab 12/25/2020 1259 12/31/2020 1421 12/28/2020 1640 01/03/2021 2129 12/18/20 0005 12/18/20 0412 12/18/20 0915 12/18/20 1528 12/18/20 2112 2021/01/10 0251 01/10/21 0310 2021-01-10 0331  NA 143   < > 144   < >  --  139 138 136 138  --  137 135  K 4.9   < > 4.9   < >  --  4.7 4.8 5.5* 4.5  --  4.8 4.3  CL 100  --  100  --   --  98  --  98 99  --  99  --   CO2 22  --  22  --   --  21*  --  19* 17*  --  16*  --   GLUCOSE 277*  --  191*  --   --  148*  --  104* 129*  --  85  --   BUN 160*  --  162*  --   --  113*  --  76* 65*  --  51*  --   CREATININE 5.45*  --  5.44*  --   --  3.92*  --  3.26* 3.14*  --  2.72*  --   CALCIUM 5.2*  --  5.4*  --   --  6.3*  --  6.5* 6.6*  --  6.7*  --   MG 2.8*  --   --   --  2.7*  --   --    --  2.6* 2.6*  --   --   PHOS  --   --  11.5*  --   --  8.6*  --  8.2*  --   --  8.0*  --    < > = values in this interval not displayed.   Liver Function Tests: Recent Labs  Lab 12/30/2020 1259 01/04/2021 1640 12/18/20 0005 12/18/20 0412 12/18/20 1528 Jan 10, 2021 0251 Jan 10, 2021 0310  AST 3,276*  --  2,818*  --   --  5,141*  --   ALT 2,332*  --  2,330*  --   --  4,065*  --   ALKPHOS 98  --  111  --   --  163*  --   BILITOT 3.1*  --  3.9*  --   --  4.8*  --   PROT 4.9*  --  5.5*  --   --  4.8*  --   ALBUMIN 1.5*   < > 1.8* 1.8* 1.8* 1.4* 1.4*   < > = values in this interval not displayed.   No results for input(s): LIPASE, AMYLASE in the  last 168 hours. No results for input(s): AMMONIA in the last 168 hours. CBC: Recent Labs  Lab 12/16/20 0516 12/16/20 0922 01/08/2021 0509 01/05/2021 1259 01/08/2021 1421 12/28/2020 2129 12/18/20 0005 12/18/20 0915 January 11, 2021 0251 Jan 11, 2021 0331  WBC 19.2*  --  30.9* 32.1*  --   --  40.3*  --  40.0*  --   HGB 18.4*   < > 13.9 12.9*   < > 13.6 14.0 15.0 12.8* 12.6*  HCT 62.2*   < > 44.1 40.0   < > 40.0 44.6 44.0 43.8 37.0*  MCV 107.4*  --  104.8* 101.5*  --   --  103.0*  --  107.9*  --   PLT 181  --  PLATELET CLUMPS NOTED ON SMEAR, COUNT APPEARS DECREASED 58*  56*  --   --  54*  --  48*  --    < > = values in this interval not displayed.   Cardiac Enzymes: No results for input(s): CKTOTAL, CKMB, CKMBINDEX, TROPONINI in the last 168 hours. Sepsis Labs: Recent Labs  Lab 12/18/2020 0509 01/02/2021 1259 12/27/2020 1640 12/18/20 0005 12/18/20 1423 11-Jan-2021 0251 January 11, 2021 1005  WBC 30.9* 32.1*  --  40.3*  --  40.0*  --   LATICACIDVEN 4.9* 4.7* 4.1*  --  7.6*  --  >11.0*    Procedures/Operations  As per EMR   Lesia Sago Rohnan Bartleson 2021/01/11, 4:30 PM

## 2021-01-11 DEATH — deceased

## 2022-01-26 IMAGING — DX DG CHEST 1V PORT
1 series · 1 of 1 positions shown · non-contrast
Comparison: 12/12/2020.

CLINICAL DATA: History of COVID.

EXAM:
PORTABLE CHEST 1 VIEW

[chest ap]
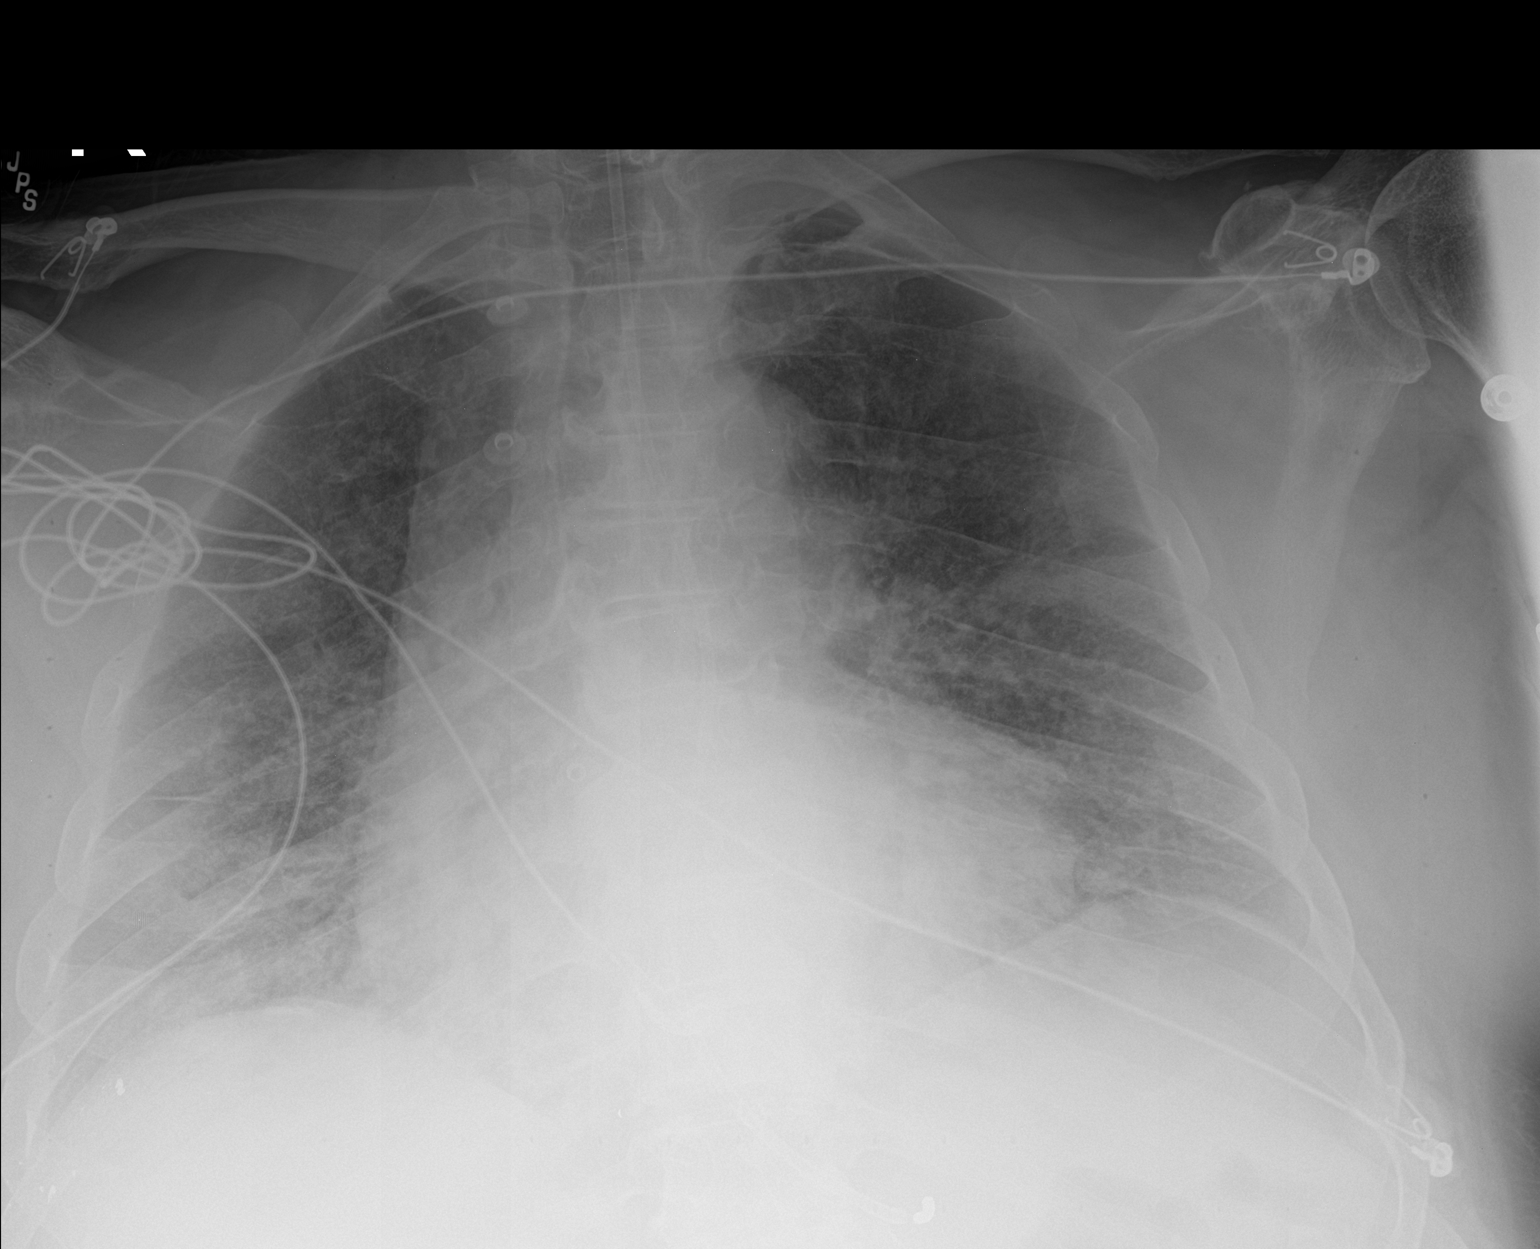

[1 of 1 positions shown; findings below may reference images not displayed]

FINDINGS: Feeding tube in stable position. Stable cardiomegaly. Diffuse
bilateral interstitial prominence consistent with interstitial edema
and or pneumonitis again noted. Slight improvement from prior exam.
No pleural effusion or pneumothorax. Tiny metallic densities again
noted over the right chest.
IMPRESSION: 1. Stable cardiomegaly.
2. Diffuse bilateral interstitial prominence consistent with
interstitial edema and or pneumonitis again noted. Slight
improvement from prior exam.

## 2022-01-26 IMAGING — DX DG CHEST 1V PORT
1 series · 1 of 1 positions shown · non-contrast
Comparison: 12/15/2020

CLINICAL DATA: Intubation.

EXAM:
PORTABLE CHEST 1 VIEW

[chest]
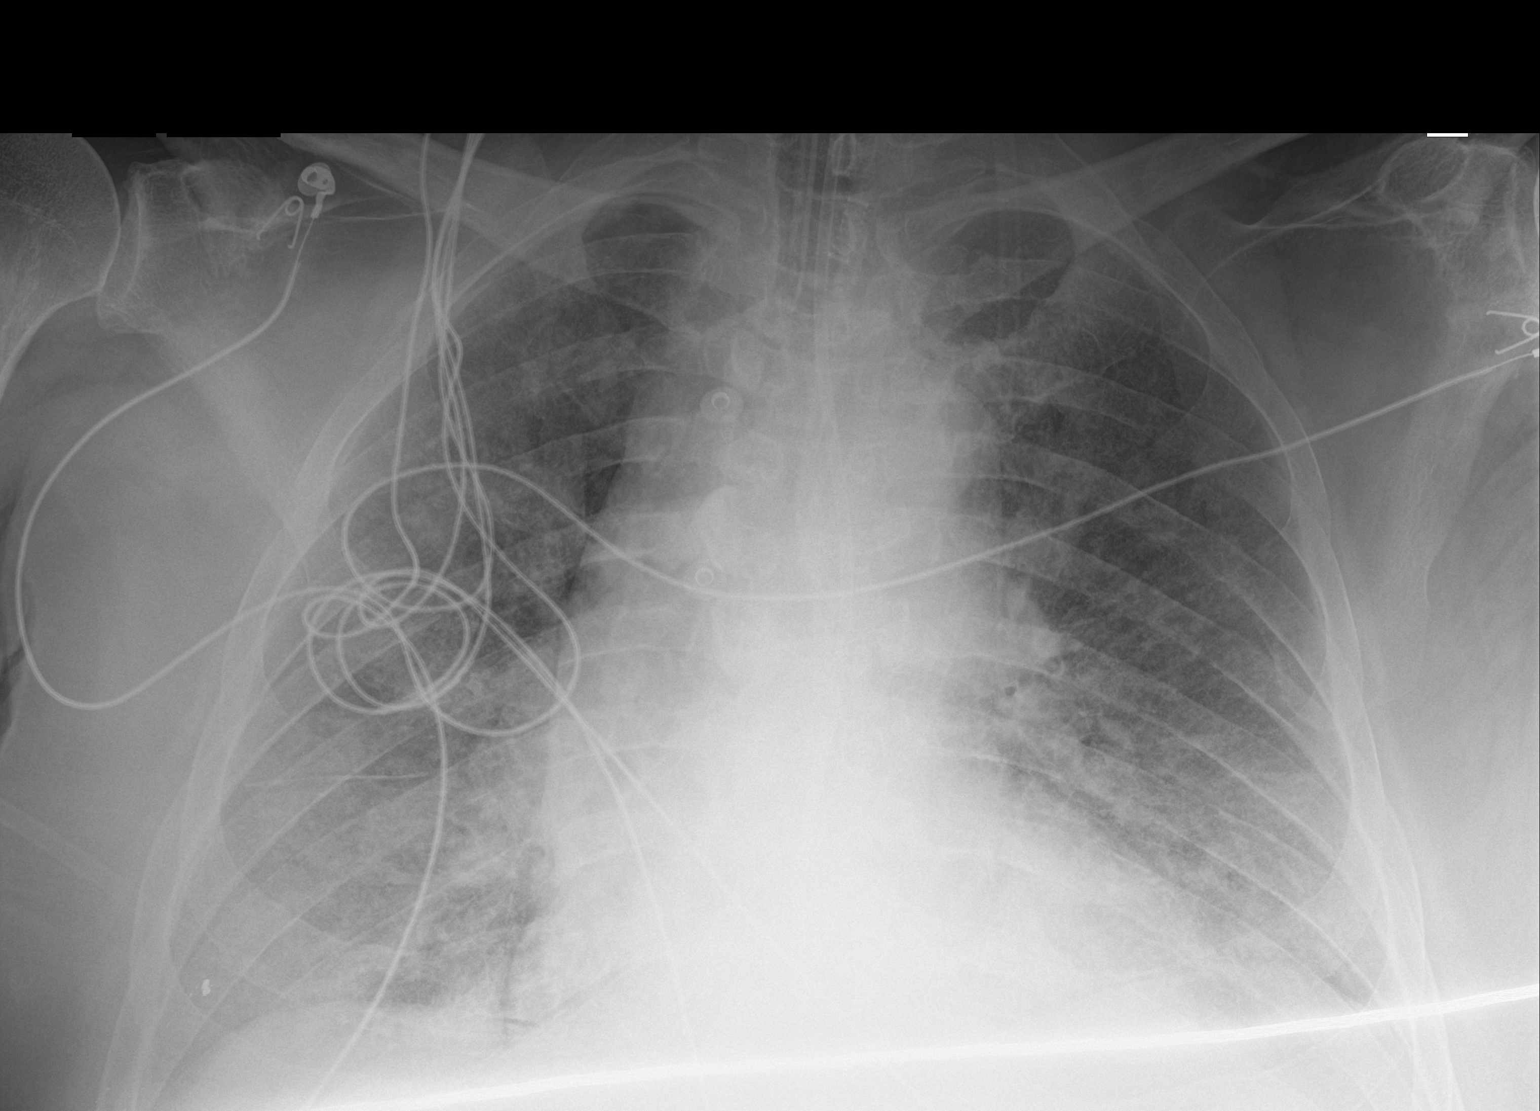

[1 of 1 positions shown; findings below may reference images not displayed]

FINDINGS: The endotracheal tube terminates at the thoracic inlet,
approximately 8 cm above the carina. The enteric tube extends below
the left hemidiaphragm with the heart size is enlarged. There is
vascular congestion and diffuse bilateral hazy interstitial
prominence. There is no pneumothorax. There are probable bilateral
pleural effusions.
IMPRESSION: 1. Lines and tubes as above. Consider further advancing the
endotracheal tube by approximately 1-2 cm.
2. Otherwise, no significant interval change in appearance of both
lung fields.

## 2022-01-27 IMAGING — DX DG CHEST 1V PORT
1 series · 1 of 1 positions shown · non-contrast
Comparison: Chest x-ray from same day at 9798 hours.

CLINICAL DATA: Hypoxia.

EXAM:
PORTABLE CHEST 1 VIEW

[chest ap]
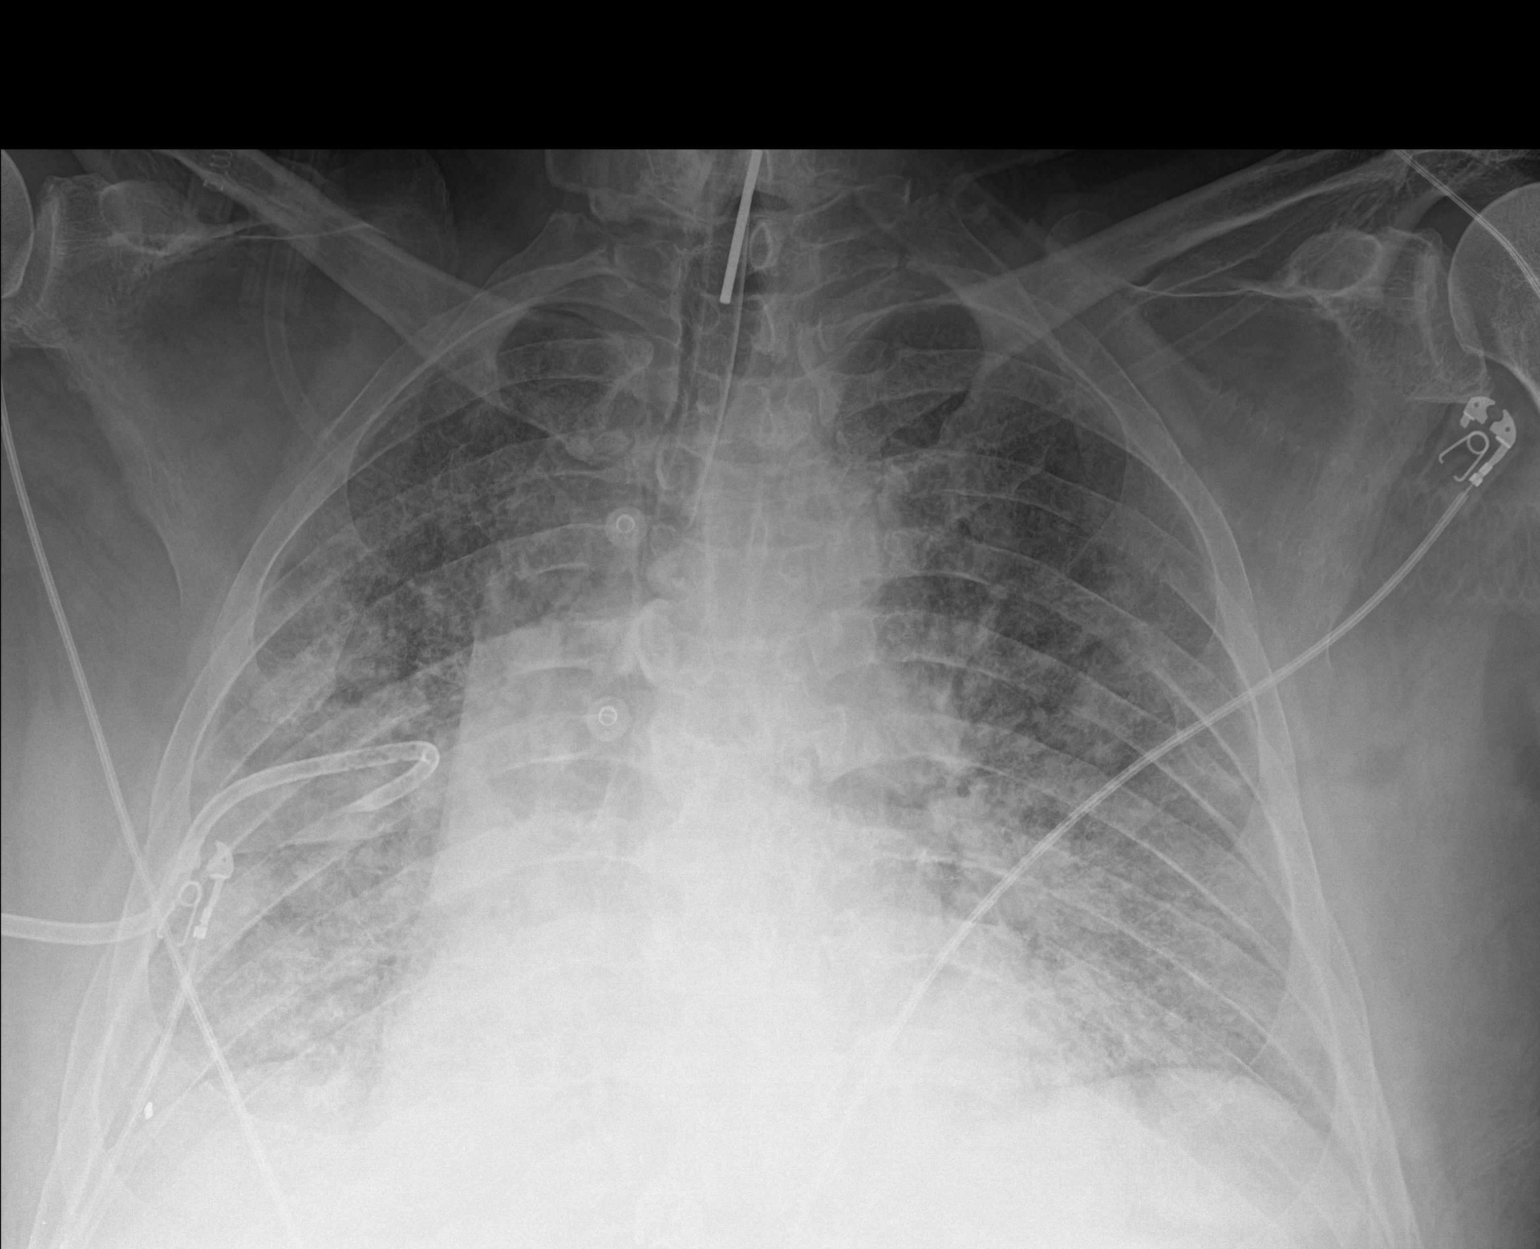

[1 of 1 positions shown; findings below may reference images not displayed]

FINDINGS: Endotracheal tube tip 6.1 cm above the carina. Interval retraction
of the feeding tube with the tip now in the upper esophagus.
Unchanged right-sided chest tube. No residual pneumothorax.
Extensive bilateral interstitial and hazy airspace opacities are
unchanged. No large pleural effusion. Stable cardiomediastinal
silhouette.
IMPRESSION: 1. Interval retraction of the feeding tube with the tip now in the
upper esophagus. Recommend repositioning.
2. Unchanged multifocal pneumonia.

## 2022-01-27 IMAGING — DX DG CHEST 1V PORT
1 series · 2 of 2 positions shown · non-contrast
Comparison: Chest x-ray 12/15/2020.

CLINICAL DATA: Intubation.  Hypoxia.  COVID.

EXAM:
PORTABLE CHEST 1 VIEW

[Series 1: chest · 0.14mm/px · 2 of 2 slices shown]
[im 1/2]
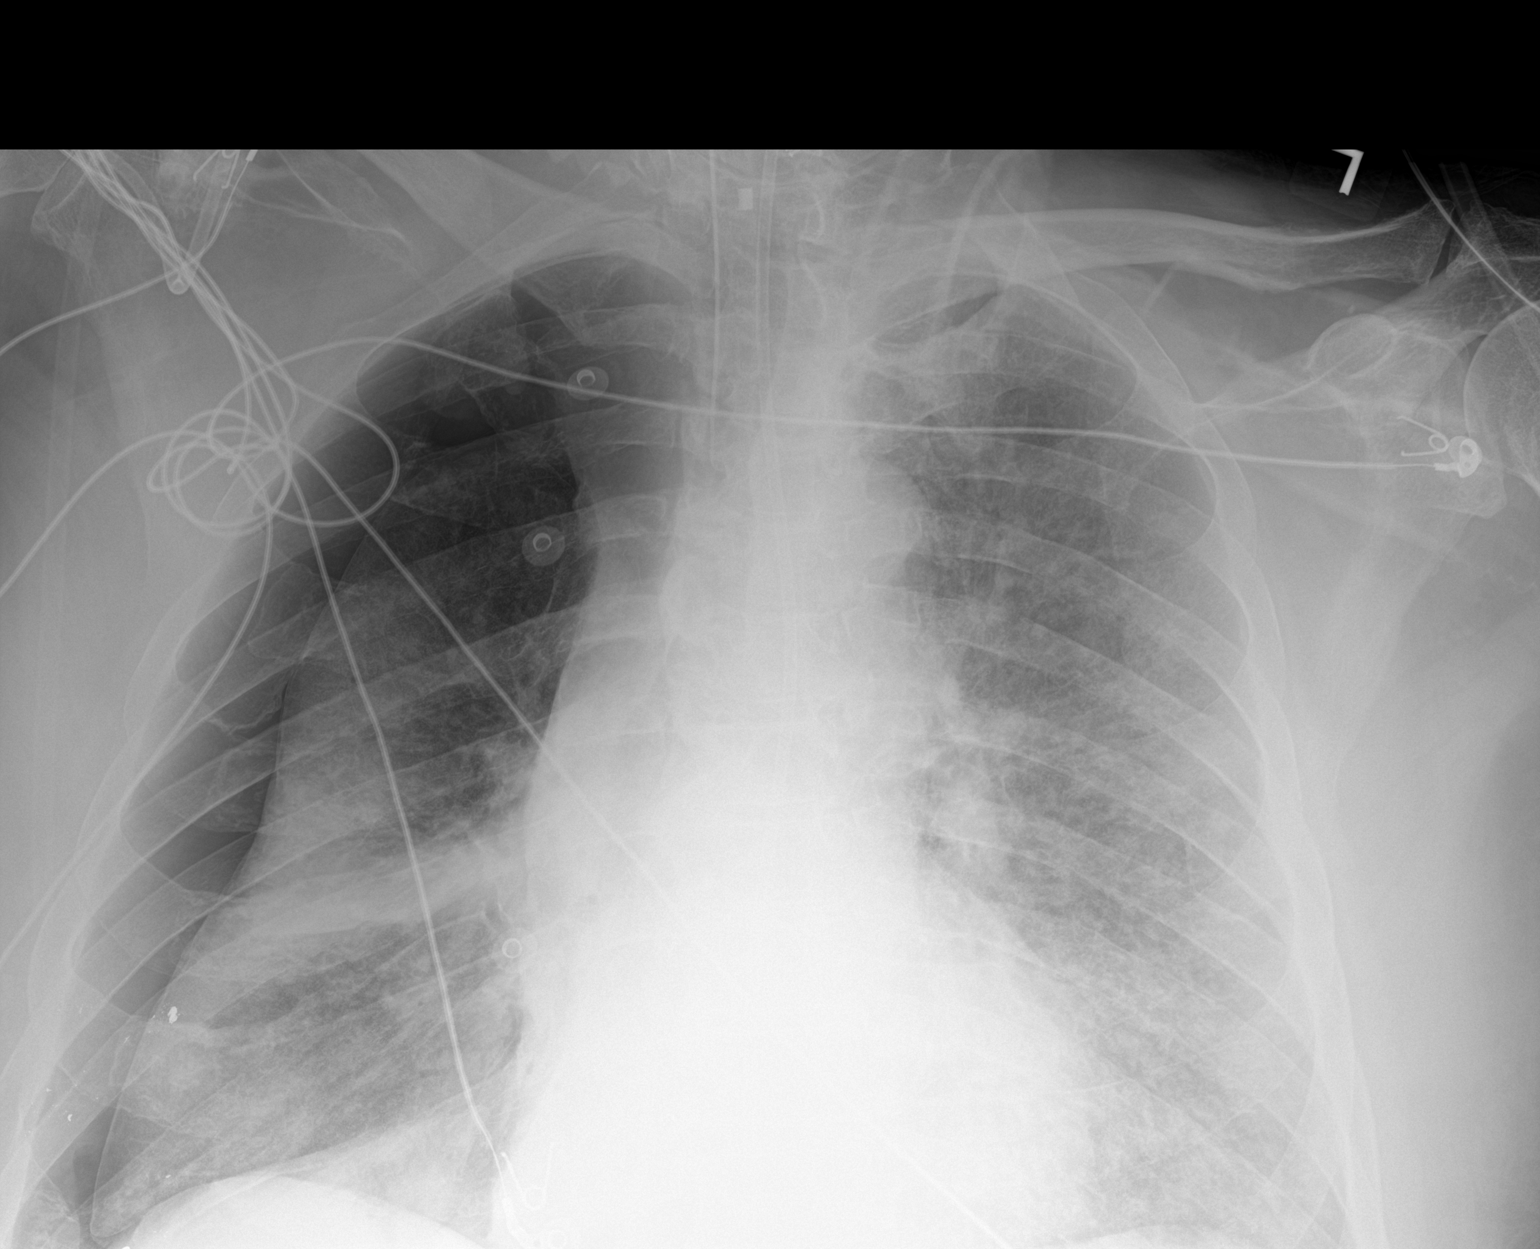
[im 2/2]
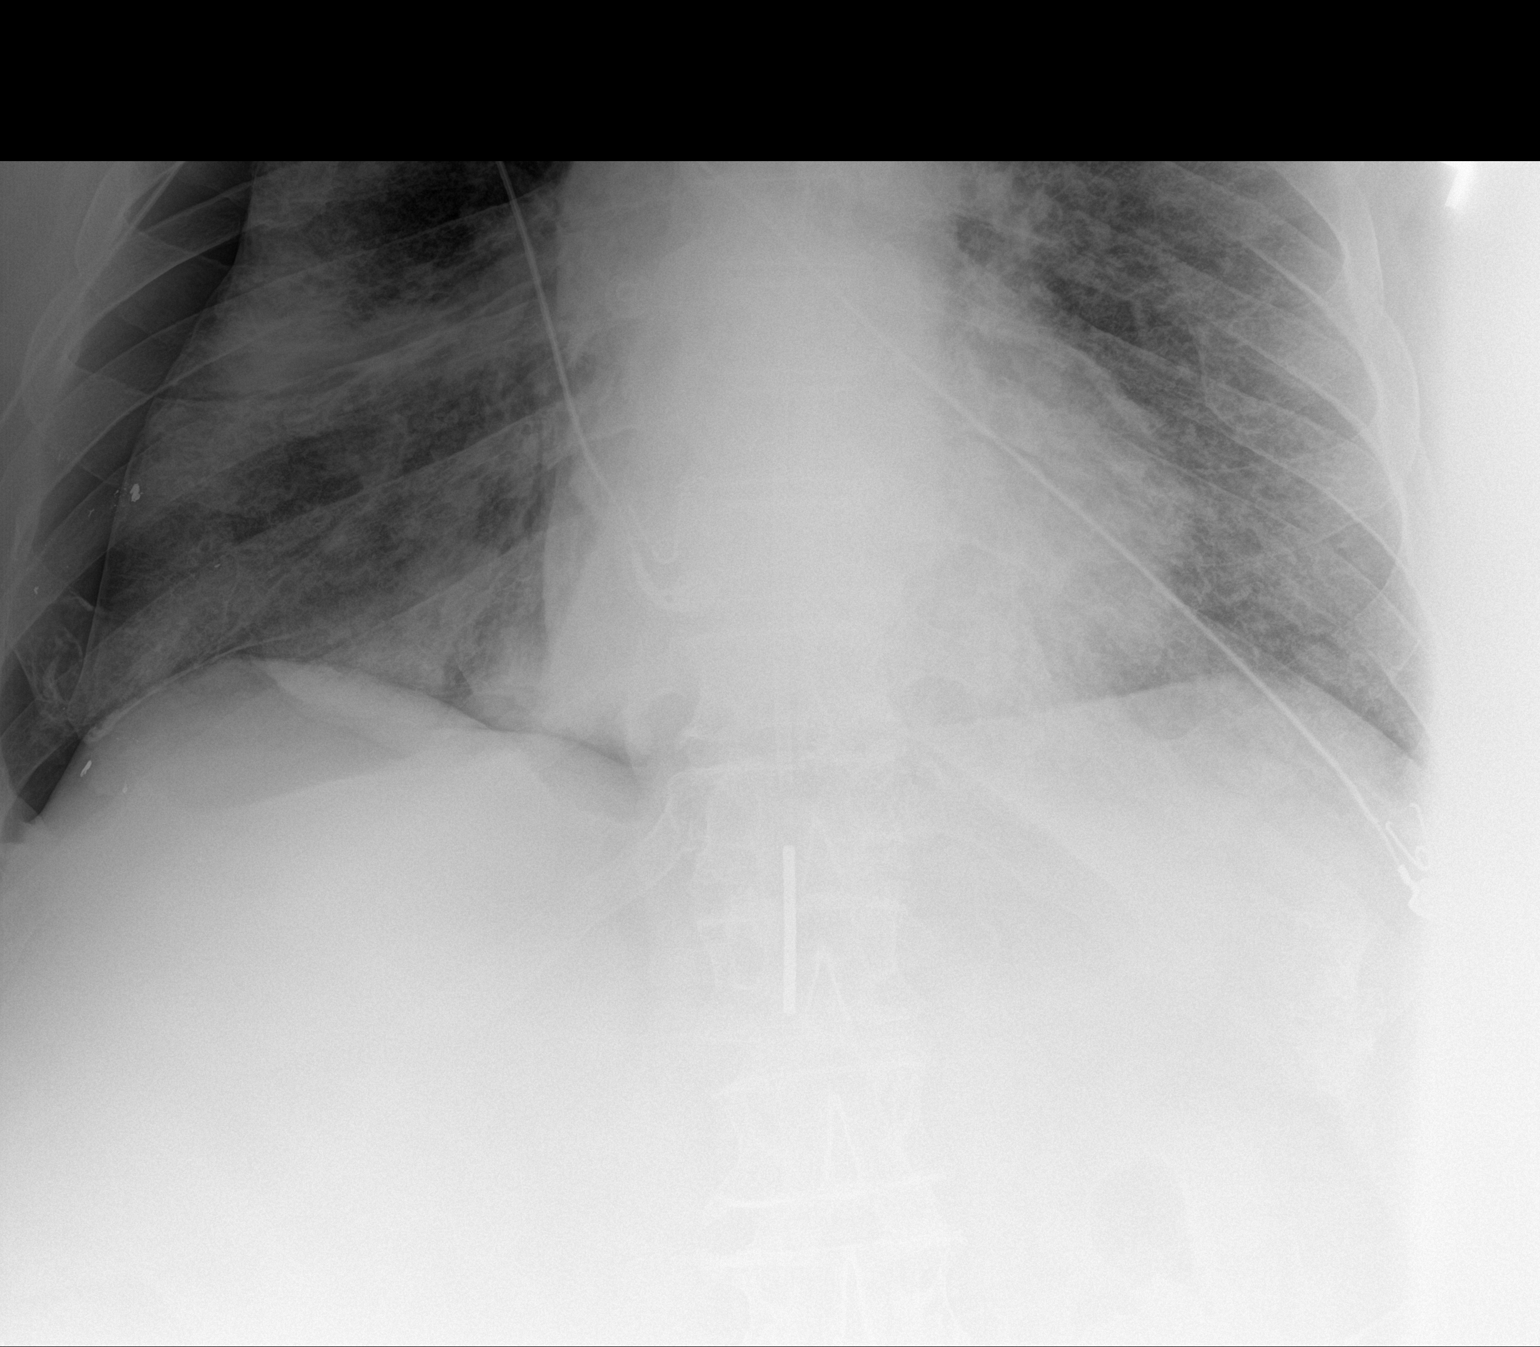

[2 of 2 positions shown; findings below may reference images not displayed]

FINDINGS: Endotracheal tube noted stable position. Feeding tube noted with tip
in the upper most portion of the stomach. Heart size stable. New
moderate sized right pneumothorax noted. Diffuse bilateral pulmonary
infiltrates again noted. No prominent pleural effusion. Costophrenic
angles incompletely imaged. Small metallic fragments again noted
over the right chest.
IMPRESSION: 1. Endotracheal tube in stable position. Feeding tube noted with tip
in the upper most portion of the stomach.
2. New moderate-sized right pneumothorax.
3. Diffuse bilateral pulmonary infiltrates again noted.

Critical Value/emergent results were called by telephone at the time
of interpretation on 12/16/2020 at [DATE] to nurse Directer, who
verbally acknowledged these results.

## 2022-01-28 IMAGING — US US HEPATIC LIVER DOPPLER
1 series · 14 of 25 positions shown · non-contrast
Comparison: None.

CLINICAL DATA: Abnormal liver function tests, respiratory failure
secondary to [id number] pneumonia and renal failure.

EXAM:
DUPLEX ULTRASOUND OF LIVER
TECHNIQUE: Color and duplex Doppler ultrasound was performed to evaluate the
hepatic in-flow and out-flow vessels.

[Series 1: us liver doppler · 14 of 44 slices shown]
[im 1/44]
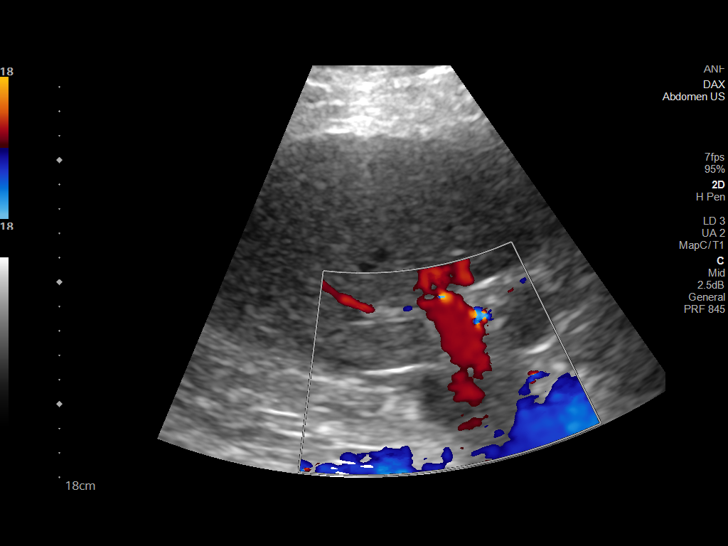
[im 4/44]
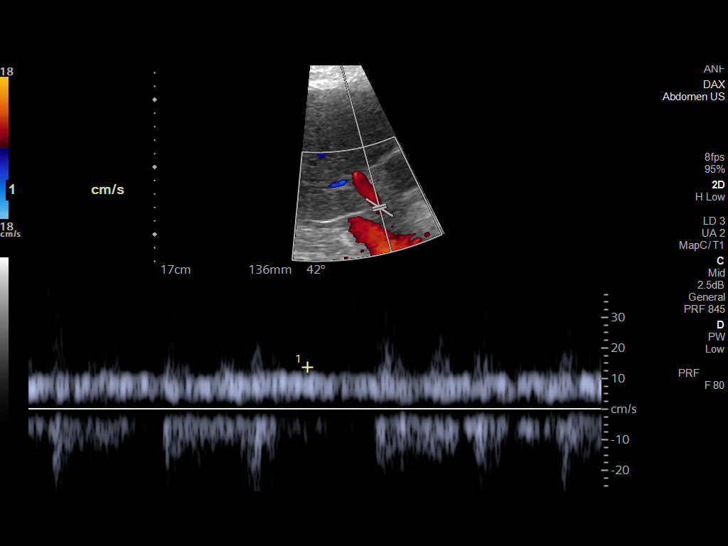
[im 8/44]
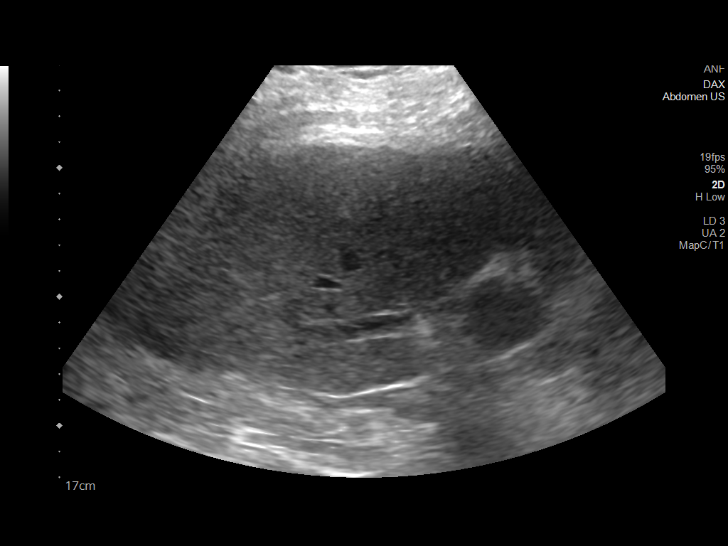
[im 11/44]
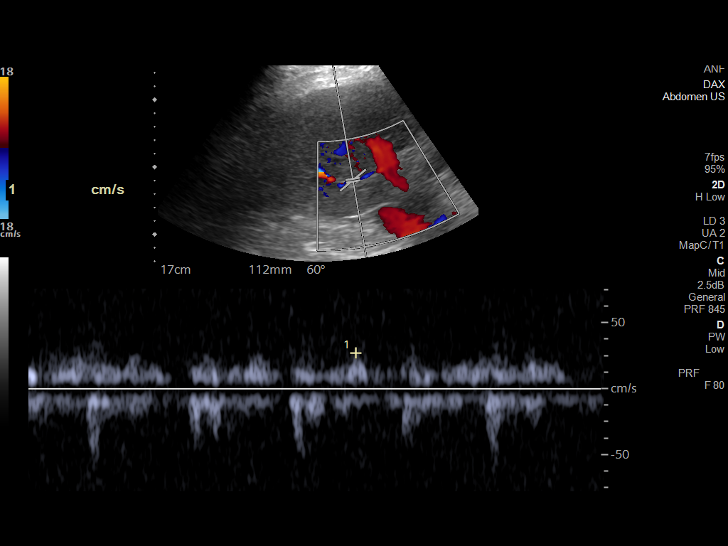
[im 15/44]
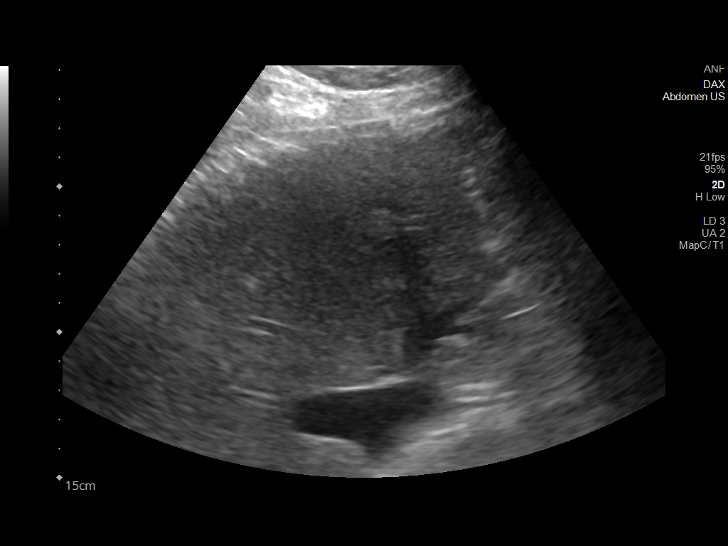
[im 17/44]
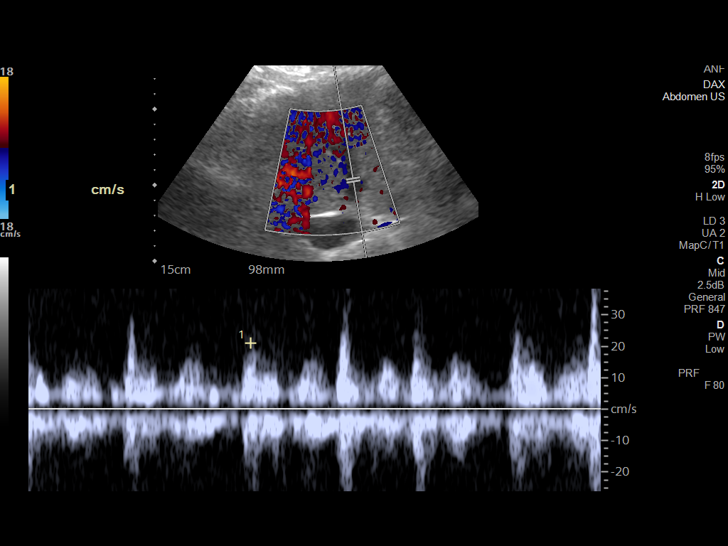
[im 20/44]
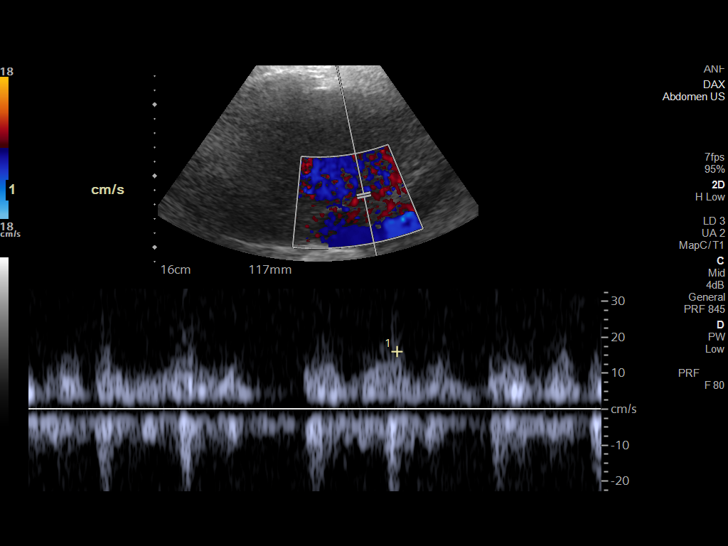
[im 24/44]
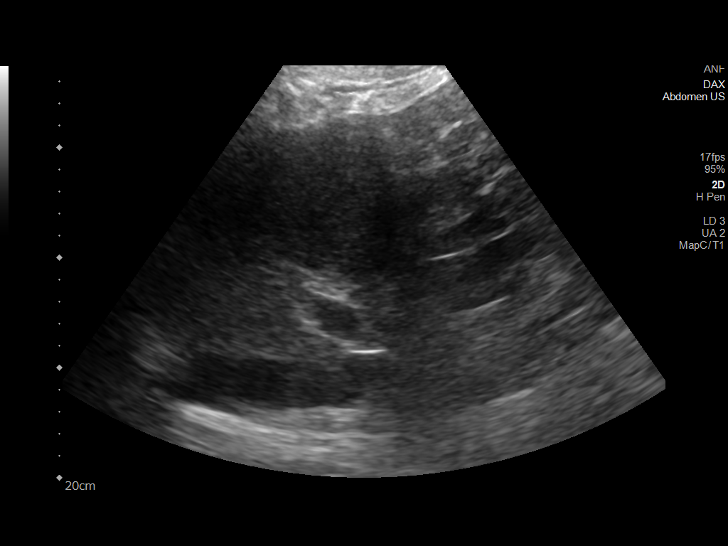
[im 27/44]
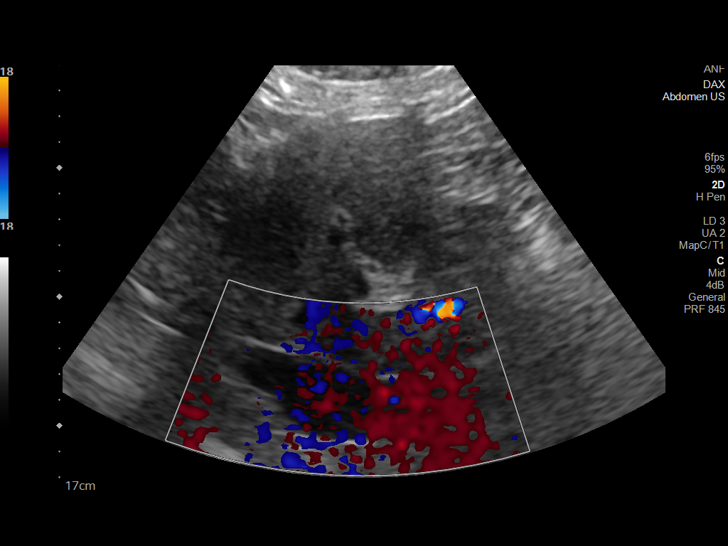
[im 29/44]
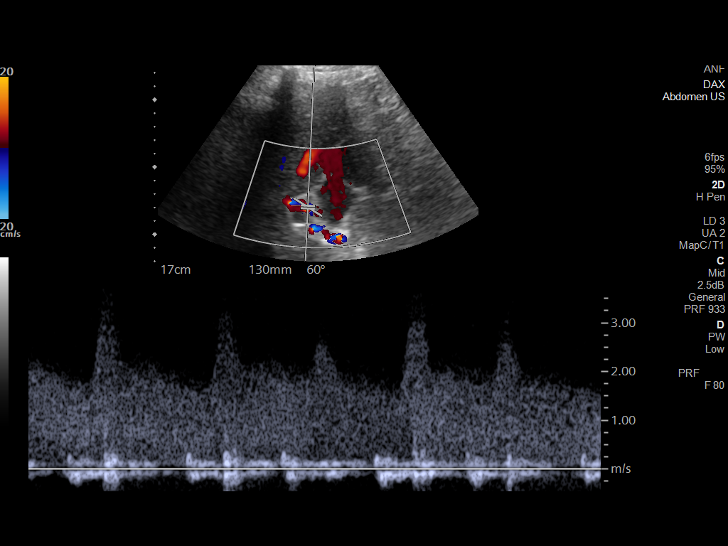
[im 33/44]
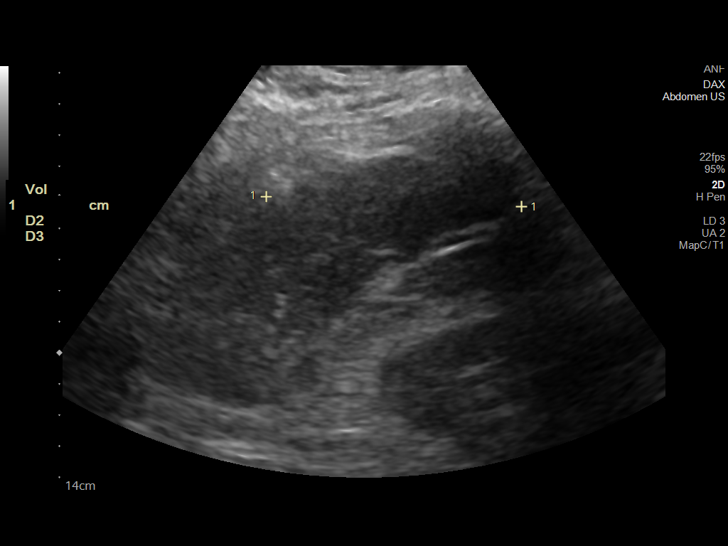
[im 36/44]
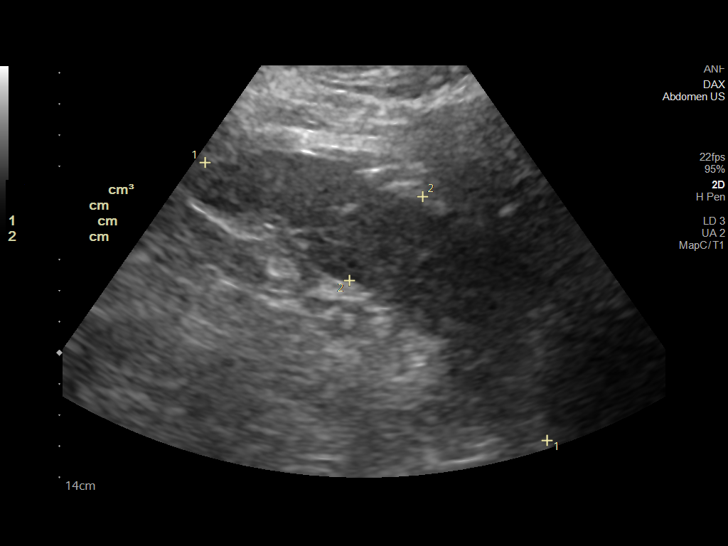
[im 40/44]
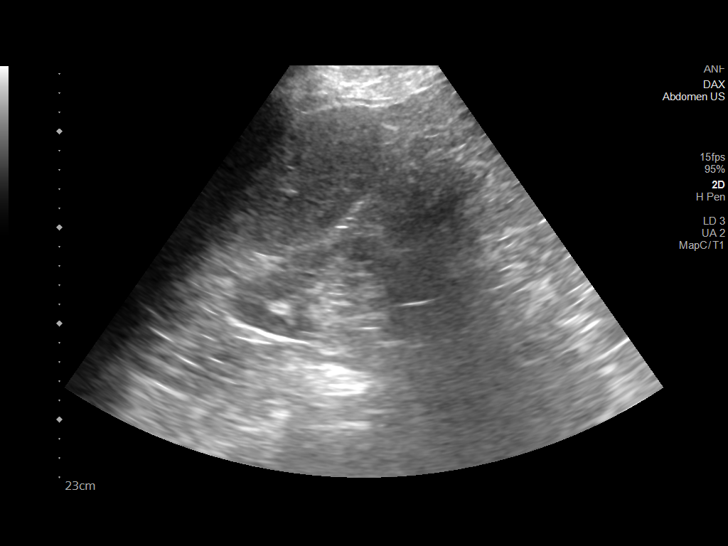
[im 44/44]
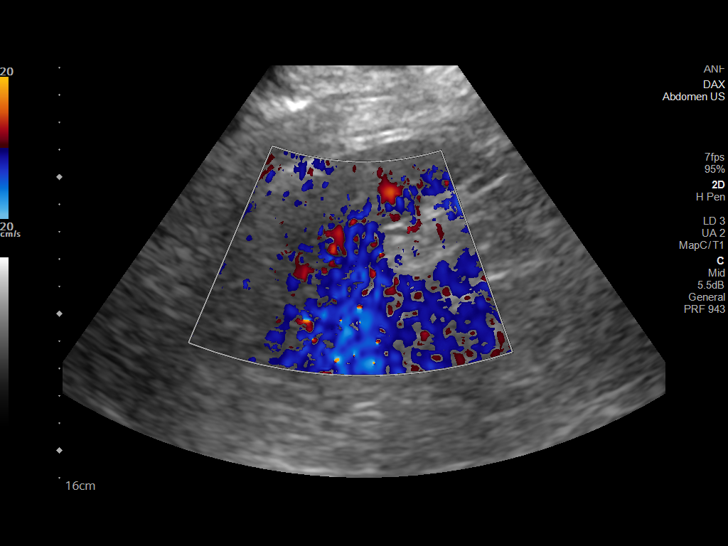

[14 of 25 positions shown; findings below may reference images not displayed]

FINDINGS: Portal Vein Velocities

Main:  9-14 cm/sec

Right:  31 cm/sec

Left:  27 cm/sec

Hepatic Vein Velocities

Right:  20 cm/sec

Middle:  19 cm/sec

Left:  21 cm/sec

Hepatic Artery Velocity:  343 cm/sec

Varices: None visualized.

Ascites: None visualized.

There is flow in the portal vein but flow appears attenuated and of
low velocity, suggestive of portal hypertension. No definite portal
vein thrombus identified. No evidence of hepatic Dwi
disease. The intrahepatic IVC demonstrates normal patency.
IMPRESSION: Low main portal vein velocities without definite portal vein
thrombus. Findings are suggestive of portal hypertension.
